# Patient Record
Sex: Female | Born: 1965 | Race: Black or African American | Hispanic: No | Marital: Single | Smoking: Never smoker
Health system: Southern US, Community
[De-identification: ages and names within clinical notes are randomized; demographics above are authoritative.]

## PROBLEM LIST (undated history)

## (undated) DIAGNOSIS — F32A Depression, unspecified: Secondary | ICD-10-CM

## (undated) DIAGNOSIS — F419 Anxiety disorder, unspecified: Secondary | ICD-10-CM

## (undated) DIAGNOSIS — E785 Hyperlipidemia, unspecified: Secondary | ICD-10-CM

## (undated) DIAGNOSIS — T7840XA Allergy, unspecified, initial encounter: Secondary | ICD-10-CM

## (undated) DIAGNOSIS — F329 Major depressive disorder, single episode, unspecified: Secondary | ICD-10-CM

## (undated) DIAGNOSIS — N6489 Other specified disorders of breast: Secondary | ICD-10-CM

## (undated) DIAGNOSIS — K589 Irritable bowel syndrome without diarrhea: Principal | ICD-10-CM

## (undated) DIAGNOSIS — B009 Herpesviral infection, unspecified: Secondary | ICD-10-CM

## (undated) DIAGNOSIS — R7303 Prediabetes: Secondary | ICD-10-CM

## (undated) DIAGNOSIS — R519 Headache, unspecified: Secondary | ICD-10-CM

## (undated) DIAGNOSIS — R51 Headache: Secondary | ICD-10-CM

## (undated) HISTORY — PX: WISDOM TOOTH EXTRACTION: SHX21

## (undated) HISTORY — DX: Irritable bowel syndrome without diarrhea: K58.9

## (undated) HISTORY — DX: Allergy, unspecified, initial encounter: T78.40XA

## (undated) HISTORY — DX: Depression, unspecified: F32.A

## (undated) HISTORY — DX: Prediabetes: R73.03

## (undated) HISTORY — DX: Major depressive disorder, single episode, unspecified: F32.9

## (undated) HISTORY — DX: Hyperlipidemia, unspecified: E78.5

## (undated) HISTORY — DX: Other specified disorders of breast: N64.89

## (undated) HISTORY — DX: Anxiety disorder, unspecified: F41.9

## (undated) HISTORY — PX: COLONOSCOPY: SHX174

## (undated) HISTORY — PX: OTHER SURGICAL HISTORY: SHX169

## (undated) HISTORY — DX: Herpesviral infection, unspecified: B00.9

---

## 1997-12-12 ENCOUNTER — Ambulatory Visit (HOSPITAL_COMMUNITY): Admission: RE | Admit: 1997-12-12 | Discharge: 1997-12-12 | Payer: Self-pay | Admitting: Obstetrics and Gynecology

## 1997-12-30 ENCOUNTER — Ambulatory Visit (HOSPITAL_COMMUNITY): Admission: RE | Admit: 1997-12-30 | Discharge: 1997-12-30 | Payer: Self-pay | Admitting: Obstetrics and Gynecology

## 2002-08-22 ENCOUNTER — Other Ambulatory Visit: Admission: RE | Admit: 2002-08-22 | Discharge: 2002-08-22 | Payer: Self-pay | Admitting: Obstetrics and Gynecology

## 2003-10-17 ENCOUNTER — Other Ambulatory Visit: Admission: RE | Admit: 2003-10-17 | Discharge: 2003-10-17 | Payer: Self-pay | Admitting: Obstetrics and Gynecology

## 2005-01-05 ENCOUNTER — Other Ambulatory Visit: Admission: RE | Admit: 2005-01-05 | Discharge: 2005-01-05 | Payer: Self-pay | Admitting: Obstetrics and Gynecology

## 2006-01-17 ENCOUNTER — Other Ambulatory Visit: Admission: RE | Admit: 2006-01-17 | Discharge: 2006-01-17 | Payer: Self-pay | Admitting: Obstetrics and Gynecology

## 2007-11-22 ENCOUNTER — Emergency Department (HOSPITAL_COMMUNITY): Admission: EM | Admit: 2007-11-22 | Discharge: 2007-11-22 | Payer: Self-pay | Admitting: Emergency Medicine

## 2008-04-16 ENCOUNTER — Ambulatory Visit (HOSPITAL_COMMUNITY): Admission: RE | Admit: 2008-04-16 | Discharge: 2008-04-16 | Payer: Self-pay | Admitting: Obstetrics and Gynecology

## 2008-06-25 ENCOUNTER — Encounter: Admission: RE | Admit: 2008-06-25 | Discharge: 2008-06-25 | Payer: Self-pay | Admitting: Internal Medicine

## 2009-01-22 ENCOUNTER — Ambulatory Visit: Payer: Self-pay | Admitting: Family Medicine

## 2009-01-22 DIAGNOSIS — R519 Headache, unspecified: Secondary | ICD-10-CM | POA: Insufficient documentation

## 2009-01-22 DIAGNOSIS — R51 Headache: Secondary | ICD-10-CM | POA: Insufficient documentation

## 2009-01-27 ENCOUNTER — Encounter: Admission: RE | Admit: 2009-01-27 | Discharge: 2009-01-27 | Payer: Self-pay | Admitting: Family Medicine

## 2009-01-29 ENCOUNTER — Telehealth (INDEPENDENT_AMBULATORY_CARE_PROVIDER_SITE_OTHER): Payer: Self-pay | Admitting: *Deleted

## 2009-03-05 ENCOUNTER — Ambulatory Visit: Payer: Self-pay | Admitting: Family Medicine

## 2009-04-22 ENCOUNTER — Encounter: Admission: RE | Admit: 2009-04-22 | Discharge: 2009-04-22 | Payer: Self-pay | Admitting: Internal Medicine

## 2010-05-06 ENCOUNTER — Encounter
Admission: RE | Admit: 2010-05-06 | Discharge: 2010-05-06 | Payer: Self-pay | Source: Home / Self Care | Admitting: Internal Medicine

## 2010-08-27 IMAGING — MG MM DIGITAL DIAGNOSTIC BILAT
4 series · 4 of 4 positions shown · non-contrast
Comparison: 04/16/2008

CLINICAL DATA: The patient's physician feels a lump in the right
upper inner quadrant.  The patient is not sure of the exact
location.  She states that she has a first cousin and a paternal
aunt who are positive for the breast cancer gene.

DIGITAL DIAGNOSTIC  BILATERAL  MAMMOGRAM  WITH CAD AND RIGHT BREAST
ULTRASOUND:

[R CC]
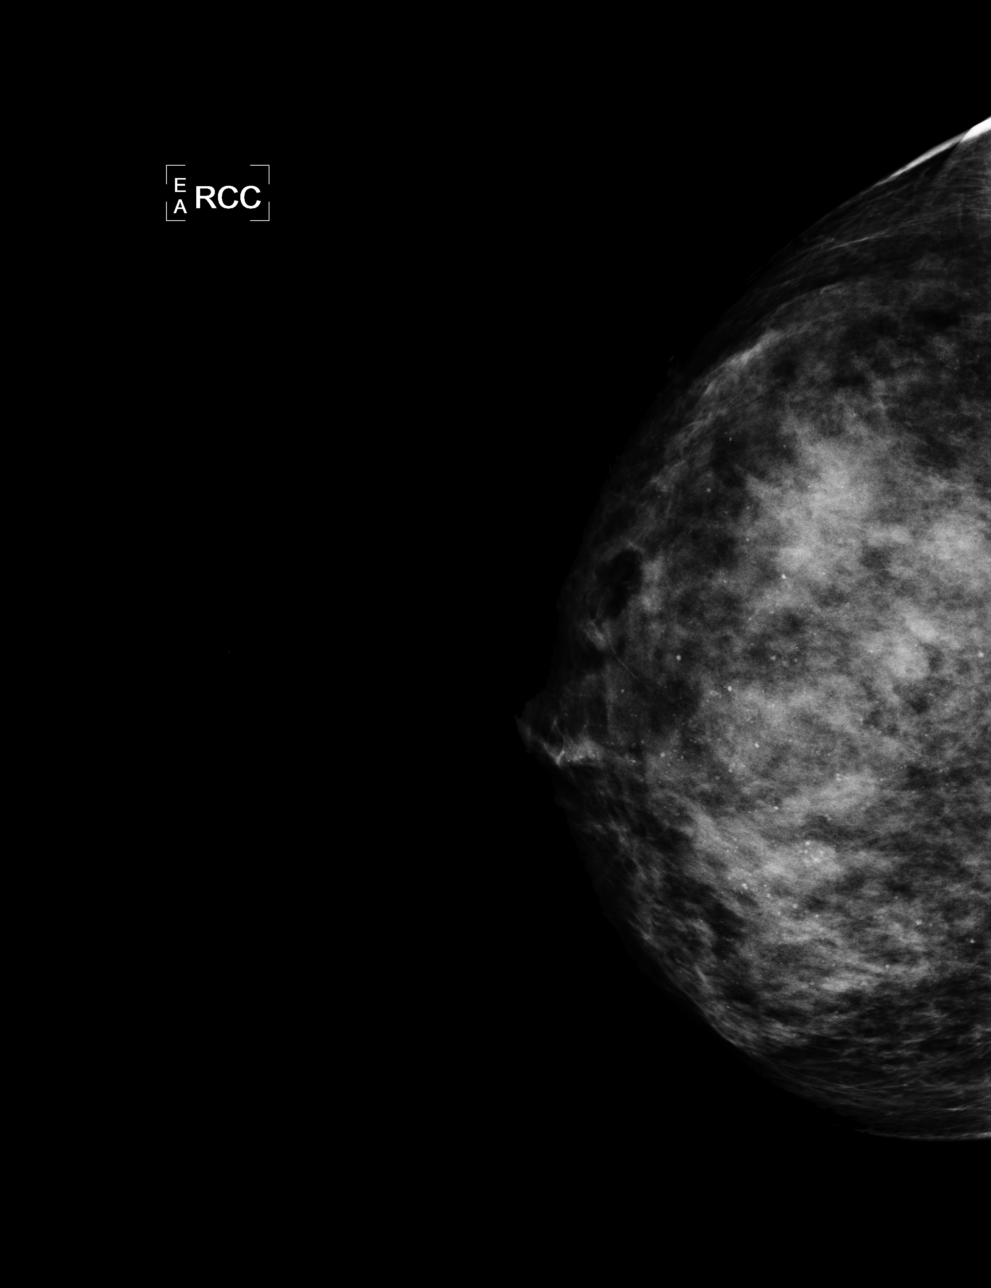

[L CC]
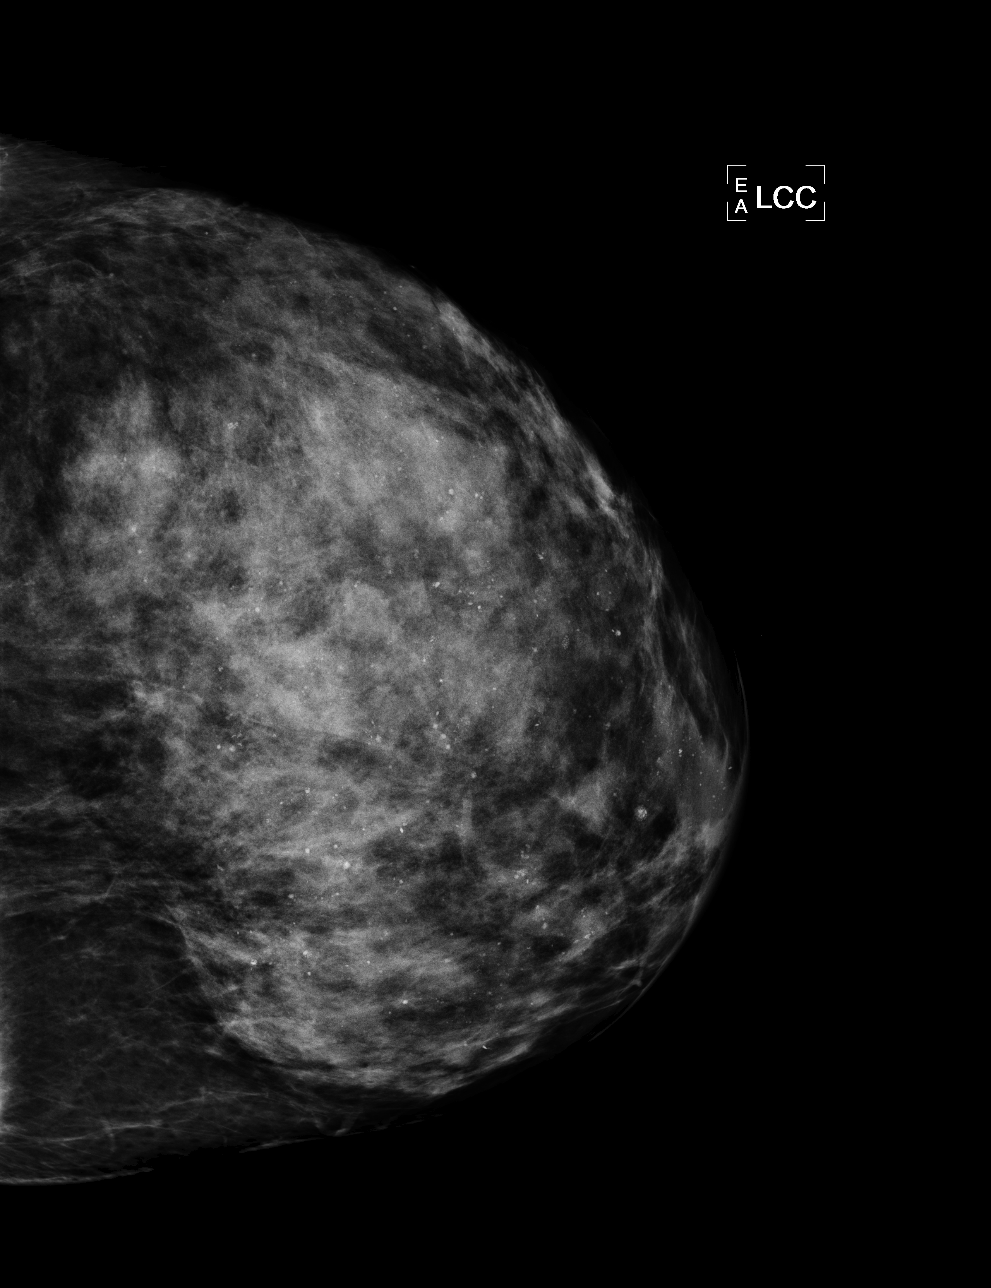

[L MLO]
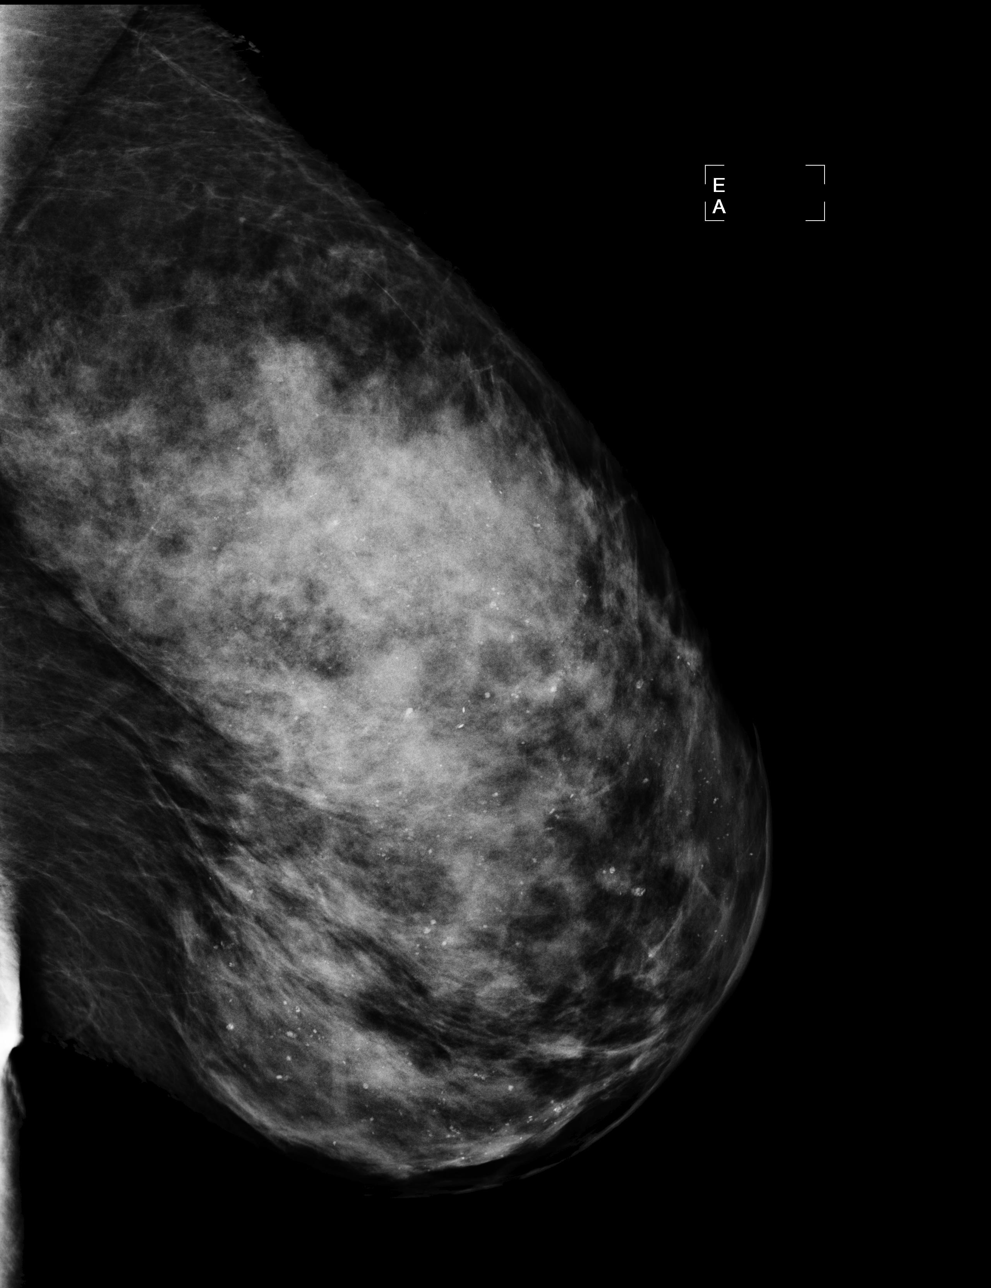

[R MLO]
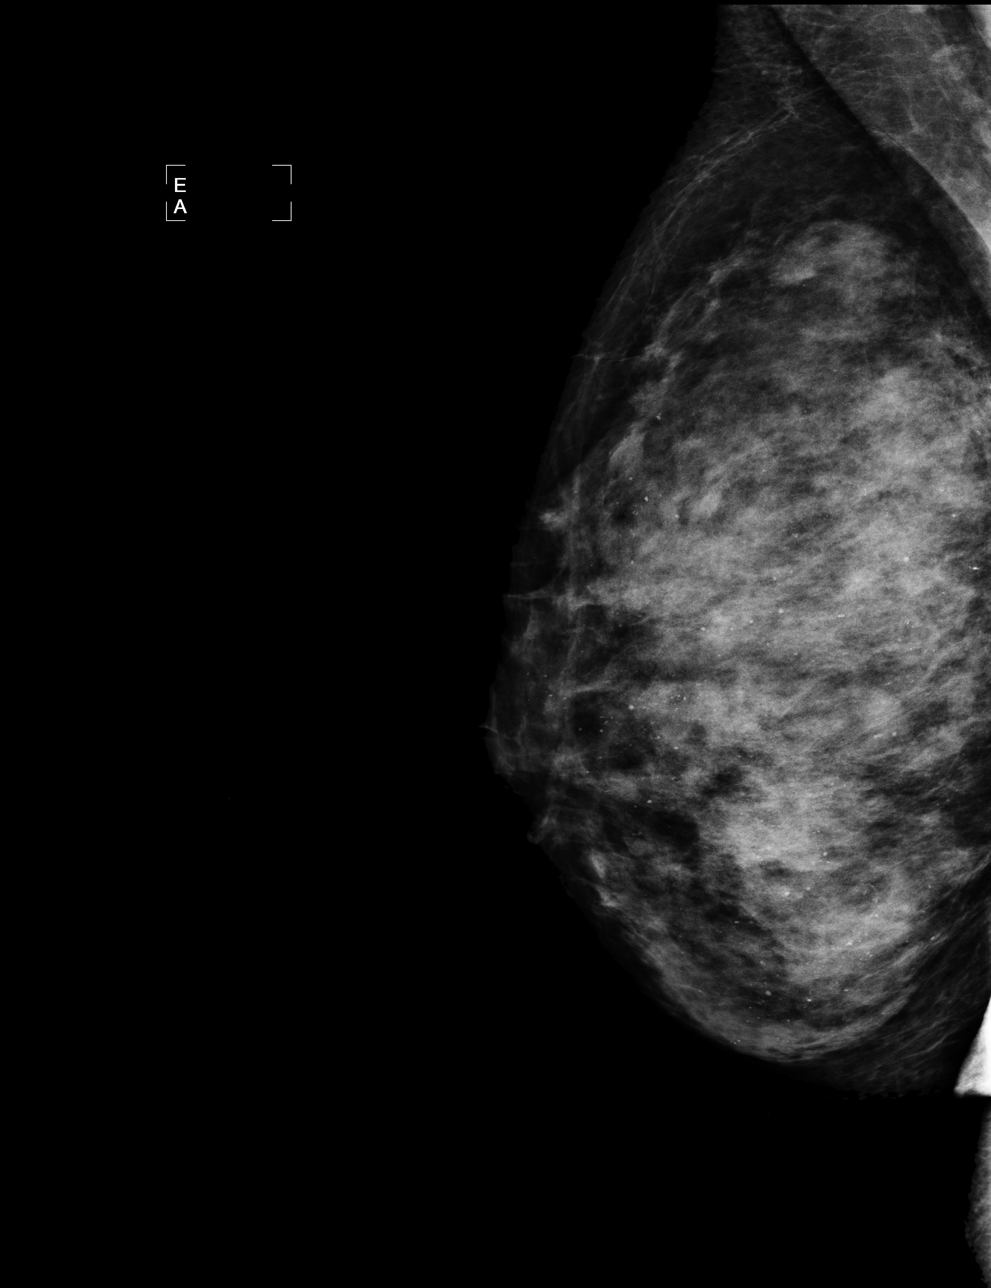

[4 of 4 positions shown; findings below may reference images not displayed]

FINDINGS: The breast tissue is extremely dense.  There is no
suspicious dominant mass, architectural distortion or calcification
to suggest malignancy.
Mammographic images were processed with CAD.

On physical exam, no distinct mass is palpated in the right upper
inner quadrant.

Ultrasound is performed, showing dense fibroglandular tissue.
There are scattered nonpalpable subcentimeter cysts.  There is no
worrisome solid mass, distortion or shadowing to suggest
malignancy.
IMPRESSION: No mammographic or sonographic evidence of malignancy.  Yearly
screening mammography is suggested.

BI-RADS CATEGORY 2:  Benign finding(s).

## 2011-04-12 ENCOUNTER — Other Ambulatory Visit: Payer: Self-pay | Admitting: Internal Medicine

## 2011-04-12 ENCOUNTER — Other Ambulatory Visit (HOSPITAL_COMMUNITY): Payer: Self-pay | Admitting: Internal Medicine

## 2011-04-12 DIAGNOSIS — Z1231 Encounter for screening mammogram for malignant neoplasm of breast: Secondary | ICD-10-CM

## 2011-05-09 ENCOUNTER — Ambulatory Visit: Payer: Self-pay

## 2011-05-12 ENCOUNTER — Ambulatory Visit (HOSPITAL_COMMUNITY)
Admission: RE | Admit: 2011-05-12 | Discharge: 2011-05-12 | Disposition: A | Discharge status: Home / Self Care | Payer: BC Managed Care – PPO | Source: Ambulatory Visit | Attending: Internal Medicine | Admitting: Internal Medicine

## 2011-05-12 DIAGNOSIS — Z1231 Encounter for screening mammogram for malignant neoplasm of breast: Secondary | ICD-10-CM | POA: Insufficient documentation

## 2011-05-16 ENCOUNTER — Other Ambulatory Visit: Payer: Self-pay | Admitting: Internal Medicine

## 2011-05-16 DIAGNOSIS — R928 Other abnormal and inconclusive findings on diagnostic imaging of breast: Secondary | ICD-10-CM

## 2011-06-01 ENCOUNTER — Ambulatory Visit
Admission: RE | Admit: 2011-06-01 | Discharge: 2011-06-01 | Disposition: A | Discharge status: Home / Self Care | Payer: BC Managed Care – PPO | Source: Ambulatory Visit | Attending: Internal Medicine | Admitting: Internal Medicine

## 2011-06-01 DIAGNOSIS — R928 Other abnormal and inconclusive findings on diagnostic imaging of breast: Secondary | ICD-10-CM

## 2011-06-03 ENCOUNTER — Other Ambulatory Visit: Payer: BC Managed Care – PPO

## 2012-01-19 ENCOUNTER — Ambulatory Visit (INDEPENDENT_AMBULATORY_CARE_PROVIDER_SITE_OTHER): Payer: BC Managed Care – PPO | Admitting: Obstetrics and Gynecology

## 2012-01-19 ENCOUNTER — Encounter: Payer: Self-pay | Admitting: Obstetrics and Gynecology

## 2012-01-19 VITALS — BP 104/68 | Ht 64.5 in | Wt 156.0 lb

## 2012-01-19 DIAGNOSIS — N6019 Diffuse cystic mastopathy of unspecified breast: Secondary | ICD-10-CM

## 2012-01-19 DIAGNOSIS — N6012 Diffuse cystic mastopathy of left breast: Secondary | ICD-10-CM | POA: Insufficient documentation

## 2012-01-19 MED ORDER — VALACYCLOVIR HCL 500 MG PO TABS: 500.0000 mg | ORAL_TABLET | Freq: Two times a day (BID) | ORAL | Status: AC

## 2012-01-19 NOTE — Progress Notes (Signed)
Subjective:  Crystal Norton is a 46 y.o. female, G0P0000, who presents for an annual exam. She has a vague hx of LLq pain yesterday which was self limited.  She has a hx of IBS with constipation. Last Pap: 06/03/2009 WNL: Yes Regular Periods:yes Contraception: abst  Monthly Breast exam:yes Tetanus<8yrs:yes Nl.Bladder Function:yes Daily BMs:no Healthy Diet:yes Calcium:no Mammogram:yes Date of Mammogram: 05/12/2011 Exercise:no Have often Exercise: occ Seatbelt: yes Abuse at home: no Stressful work:no Sigmoid-colonoscopy: no Bone Density: No PCP: Dr. Allyne Gee Change in PMH: no change Change in FMH:no change BMI-27     History   Social History  . Marital Status: Single    Spouse Name: N/A    Number of Children: N/A  . Years of Education: N/A   Social History Main Topics  . Smoking status: Never Smoker   . Smokeless tobacco: Never Used  . Alcohol Use: No  . Drug Use: No  . Sexually Active: No   Other Topics Concern  . None   Social History Narrative  . None    Menstrual cycle:   LMP: Patient's last menstrual period was 12/31/2011.           Cycle: Some irreg in June then 2 cycles in July.  Otherwise monthly menses  The following portions of the patient's history were reviewed and updated as appropriate: allergies, current medications, past family history, past medical history, past social history, past surgical history and problem list.  Review of Systems Pertinent items are noted in HPI. Breast:Negative for breast lump,nipple discharge or nipple retraction Gastrointestinal: Negative for abdominal pain, change in bowel habits or rectal bleeding Urinary:negative   Objective:    BP 104/68  Ht 5' 4.5" (1.638 m)  Wt 156 lb (70.761 kg)  BMI 26.36 kg/m2  LMP 12/31/2011    Weight:  Wt Readings from Last 1 Encounters:  01/19/12 156 lb (70.761 kg)          BMI: Body mass index is 26.36 kg/(m^2).  General Appearance: Alert, appropriate appearance for  age. No acute distress HEENT: Grossly normal Neck / Thyroid: Supple, no masses, nodes or enlargement Lungs: clear to auscultation bilaterally Back: No CVA tenderness Breast Exam: asymmetric size is chronic.  Bilateral fibrocystic changes without dominant masses, discharge or skin changes Cardiovascular: Regular rate and rhythm. S1, S2, no murmur Gastrointestinal: Soft, non-tender, no masses or organomegaly Pelvic Exam: Vulva and vagina appear normal. Bimanual exam reveals normal uterus and adnexa. Rectovaginal: normal rectal, no masses Lymphatic Exam: Non-palpable nodes in neck, clavicular, axillary, or inguinal regions Skin: no rash or abnormalities Neurologic: Normal gait and speech, no tremor  Psychiatric: Alert and oriented, appropriate affect.   Wet Prep:not applicable Urinalysis:not applicable UPT: Not done   Assessment:    Normal gyn exam Breast asymmetry- stable  New onset transient LLQ pain   Plan:    mammogram pap smear due 2014 return annually or prn Calendar menses and pain for the next 3 months and RTO if persists. Pt will call with name of GI MD seen previously for referral for colonoscopy for family hx of colon cancer STD screening: declined Contraception:no method, declines      Dierdre Forth MD

## 2012-02-22 ENCOUNTER — Ambulatory Visit: Payer: Self-pay | Admitting: Obstetrics and Gynecology

## 2012-04-13 ENCOUNTER — Telehealth: Payer: Self-pay | Admitting: Obstetrics and Gynecology

## 2012-04-13 NOTE — Telephone Encounter (Signed)
Pt made aware last pap was 2011 not due until 2014 Pt advised to make provider aware that she always wants her pap done  Even if her results were WNL. Pt agreeable   [facility] CMA

## 2012-04-13 NOTE — Telephone Encounter (Signed)
Pt was called back regarding request for results. Pt thought she had a pap smear done. It is documented her pap is not due until 2014 Pt chart was pulled and last pap was 01/11/2010"WNL" Pt hung up while I had her on hold  Pt was called back and not ava will await pt call back.  [facility] CMA

## 2012-05-02 ENCOUNTER — Telehealth: Payer: Self-pay | Admitting: Obstetrics and Gynecology

## 2012-05-02 ENCOUNTER — Other Ambulatory Visit: Payer: Self-pay | Admitting: Obstetrics and Gynecology

## 2012-05-02 DIAGNOSIS — Z1231 Encounter for screening mammogram for malignant neoplasm of breast: Secondary | ICD-10-CM

## 2012-05-02 NOTE — Telephone Encounter (Signed)
Spoke with pt rgd concerns pt states had AEX 01/2012 wants referral for colonoscopy due to family history was advised by [facility] to call with provider information that she previously seen for stomach issues pt calling with that information and to get referral for colonoscopy pt seen Dr.James Edwards 09-11-2006 at [facility] [phone] and Dr.George Virginia Rochester 01-09-2007 at [facility] advised pt to sign release of information and have records faxed to office so [facility] and view records to make referral advised pt will send information  to [facility] pt voice understanding

## 2012-05-02 NOTE — Telephone Encounter (Signed)
Returning your call. °

## 2012-05-10 NOTE — Telephone Encounter (Signed)
rec pt be referred back to [facility] since her PCP is there as well

## 2012-05-10 NOTE — Telephone Encounter (Signed)
plese call pt thanks

## 2012-05-16 NOTE — Telephone Encounter (Signed)
Lm on vm to cb per [facility] recs.  

## 2012-05-25 NOTE — Telephone Encounter (Signed)
Lm on vm to cb per [facility] recs.

## 2012-05-28 NOTE — Telephone Encounter (Signed)
Tc from pt. Told pt [facility] recs. Pt voices understanding. Will follow up with pt once referral make.

## 2012-05-28 NOTE — Telephone Encounter (Signed)
Consulted with [facility], told needs records from prev GI physician pt saw and will review records to determine if appt can be scheduled. Pt voices understanding. Awaiting prev GI records to fax to [facility] for review.

## 2012-05-31 ENCOUNTER — Ambulatory Visit (HOSPITAL_COMMUNITY)
Admission: RE | Admit: 2012-05-31 | Discharge: 2012-05-31 | Disposition: A | Discharge status: Home / Self Care | Payer: BC Managed Care – PPO | Source: Ambulatory Visit | Attending: Obstetrics and Gynecology | Admitting: Obstetrics and Gynecology

## 2012-05-31 DIAGNOSIS — Z1231 Encounter for screening mammogram for malignant neoplasm of breast: Secondary | ICD-10-CM | POA: Insufficient documentation

## 2012-06-01 ENCOUNTER — Ambulatory Visit (HOSPITAL_COMMUNITY): Payer: BC Managed Care – PPO

## 2012-06-06 ENCOUNTER — Encounter: Payer: Self-pay | Admitting: Obstetrics and Gynecology

## 2012-06-06 DIAGNOSIS — R922 Inconclusive mammogram: Secondary | ICD-10-CM | POA: Insufficient documentation

## 2012-06-07 ENCOUNTER — Encounter: Payer: Self-pay | Admitting: Obstetrics and Gynecology

## 2012-06-19 ENCOUNTER — Ambulatory Visit (HOSPITAL_COMMUNITY): Payer: BC Managed Care – PPO

## 2013-03-13 ENCOUNTER — Telehealth: Payer: Self-pay | Admitting: Genetic Counselor

## 2013-03-13 NOTE — Telephone Encounter (Signed)
LVOM FOR PT TO RETURN CALL IN RE TO REFERRAL.  °

## 2013-03-14 ENCOUNTER — Telehealth: Payer: Self-pay | Admitting: Genetic Counselor

## 2013-03-14 NOTE — Telephone Encounter (Signed)
2nd. Lm for pt to return call in re to referral.

## 2013-03-21 ENCOUNTER — Encounter (HOSPITAL_COMMUNITY): Payer: Self-pay | Admitting: Emergency Medicine

## 2013-03-21 ENCOUNTER — Emergency Department (INDEPENDENT_AMBULATORY_CARE_PROVIDER_SITE_OTHER)
Admission: EM | Admit: 2013-03-21 | Discharge: 2013-03-21 | Disposition: A | Discharge status: Home / Self Care | Payer: Worker's Compensation | Source: Home / Self Care | Attending: Emergency Medicine | Admitting: Emergency Medicine

## 2013-03-21 DIAGNOSIS — T07XXXA Unspecified multiple injuries, initial encounter: Secondary | ICD-10-CM

## 2013-03-21 MED ORDER — HYDROCODONE-ACETAMINOPHEN 5-325 MG PO TABS: ORAL_TABLET | ORAL | Status: DC

## 2013-03-21 NOTE — ED Provider Notes (Signed)
Chief Complaint:   Chief Complaint  Patient presents with  . 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 She tripped over a chair, falling to her right side. She did not hit her head and there was no loss of consciousness. She was able to get up thereafter, but had soreness and aching in multiple areas including her neck, right shoulder, left shoulder, right knee which was bruised, both feet, left wrist, and right flank area. She denies any headache. She does feel somewhat dizzy and notes slight disequilibrium. She denies any visual symptoms, injury to the chest or abdomen, or neurological symptoms.  Review of Systems:  Other than noted above, the patient denies any of the following symptoms: Systemic:  No fevers or chills. Eye:  No diplopia or blurred vision. ENT:  No headache, facial pain, or bleeding from the nose or ears.  No loose or broken teeth. Neck:  No neck pain or stiffnes. Resp:  No shortness of breath. Cardiac:  No chest pain. No palpitations, dizziness, syncope or fainting. GI:  No abdominal pain. No nausea, vomiting, or diarrhea. GU:  No blood in urine. M-S:  No extremity pain, swelling, bruising, limited ROM, neck or back pain. Neuro:  No headache, loss of consciousness, seizure activity, dizziness, vertigo, paresthesias, numbness, or weakness.  No difficulty with speech or ambulation.   PMFSH:  Past medical history, family history, social history, meds, and allergies were reviewed.  Her only medication is Valtrex.  Physical Exam:   Vital signs:  BP 121/84  Pulse 73  Temp(Src) 98.5 F (36.9 C) (Oral)  Resp 18  SpO2 100%  LMP 01/21/2013 General:  Alert, oriented and in no distress. Eye:  PERRL, full EOMs. ENT:  No cranial or facial tenderness to palpation. Neck:  No tenderness to palpation.  Full ROM pain on rotation. Heart:  Regular rhythm.  No  extrasystoles, gallops, or murmers. Lungs:  No chest wall tenderness to palpation. Breath sounds clear and equal bilaterally.  No wheezes, rales or rhonchi. Abdomen:  Non tender. Back:  She has mild tenderness to palpation in the right flank area.  Full ROM without pain. Extremities:  There is mild tenderness to palpation in both shoulders, both both shoulders have full range of motion with only minimal pain. She has slight soreness to palpation in the left upper arm. There is no pain to palpation over the elbows, wrists, or hands. Exam of the lower extremities reveals some bruising and slight pain to palpation just below the right patella, but her knee has a full range of motion with only minimal pain. Otherwise lower extremity exam was unremarkable.  Pulses full.  Brisk capillary refill. Neuro:  Alert and oriented times 3.  Cranial nerves intact.  No muscle weakness.  Sensation intact to light touch.  Gait normal. Skin:   No abrasions, or lacerations. She has a bruise just below the right patella.  Course in Urgent Care Center:   The right knee was wrapped with Ace wrap.  Assessment:  The encounter diagnosis was Multiple contusions.  No indication for x-rays. She was given a note to remain out of work tomorrow. Should followup at occupational clinic next week if she feels she is unable to return to work by Monday.  Plan:   1.  Meds:  The following meds were prescribed:   Discharge Medication List as of 03/21/2013  9:25 PM    START taking these medications   Details  HYDROcodone-acetaminophen (NORCO/VICODIN) 5-325 MG per tablet 1 to 2 tabs every 4 to 6 hours as needed for pain., Print        2.  Patient Education/Counseling:  The patient was given appropriate handouts, self care instructions, and instructed in symptomatic relief.  Instructed in use of rest, ice, elevation, and compression.  3.  Follow up:  The patient was told to follow up if no better in 3 to 4 days, if becoming worse in  any way, and given some red flag symptoms such as worsening pain, severe headache, new neurological symptoms, or persistent vomiting which would prompt immediate return.  Follow up here as necessary or at the occupational clinic.     Reuben Likes, MD 03/21/13 2200

## 2013-03-21 NOTE — ED Notes (Addendum)
Tripped over a chair and/orcord in the classroom today.  Pt. 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 States she fell on her R side.  C/o pain in both upper arms, R knee, R side of neck down to her shoulder and dorsum L foot.  No LOC.  Consc. and alert and ambulatory.  She reported it to her Musician.

## 2013-03-25 ENCOUNTER — Telehealth: Payer: Self-pay | Admitting: Genetic Counselor

## 2013-03-25 NOTE — Telephone Encounter (Signed)
Returned call to discuss cost of testing.  Patient has a known family mutation.  Discussed that all labs will preauthorize testing and let her know if she has an OOP cost over $100.  Asked patient to bring in a copy of the test report that indicates the family mutation.

## 2013-03-25 NOTE — Telephone Encounter (Signed)
Pt called to scheduled genetic appt 01/26 @ 2 w/Dr. Lowell Guitar Welcome packet mailed.

## 2013-05-24 ENCOUNTER — Other Ambulatory Visit (HOSPITAL_COMMUNITY): Payer: Self-pay | Admitting: Internal Medicine

## 2013-05-24 ENCOUNTER — Other Ambulatory Visit: Payer: Self-pay | Admitting: Internal Medicine

## 2013-05-24 DIAGNOSIS — Z1231 Encounter for screening mammogram for malignant neoplasm of breast: Secondary | ICD-10-CM

## 2013-06-03 ENCOUNTER — Ambulatory Visit: Payer: Self-pay

## 2013-06-04 ENCOUNTER — Ambulatory Visit
Admission: RE | Admit: 2013-06-04 | Discharge: 2013-06-04 | Disposition: A | Discharge status: Home / Self Care | Payer: BC Managed Care – PPO | Source: Ambulatory Visit | Attending: Internal Medicine | Admitting: Internal Medicine

## 2013-06-04 ENCOUNTER — Other Ambulatory Visit: Payer: Self-pay | Admitting: Occupational Medicine

## 2013-06-04 ENCOUNTER — Ambulatory Visit: Payer: Self-pay

## 2013-06-04 DIAGNOSIS — R52 Pain, unspecified: Secondary | ICD-10-CM

## 2013-06-04 DIAGNOSIS — Z1231 Encounter for screening mammogram for malignant neoplasm of breast: Secondary | ICD-10-CM

## 2013-06-10 ENCOUNTER — Ambulatory Visit (HOSPITAL_COMMUNITY): Payer: BC Managed Care – PPO

## 2013-07-01 ENCOUNTER — Encounter: Payer: Self-pay | Admitting: Genetic Counselor

## 2013-07-01 ENCOUNTER — Other Ambulatory Visit: Payer: Self-pay

## 2013-08-30 ENCOUNTER — Telehealth: Payer: Self-pay | Admitting: *Deleted

## 2013-08-30 NOTE — Telephone Encounter (Signed)
Left message for pt to return my call so I can reschedule her genetic appt. 

## 2013-09-03 ENCOUNTER — Telehealth: Payer: Self-pay | Admitting: *Deleted

## 2013-09-03 NOTE — Telephone Encounter (Signed)
Left message for pt to return my call so I can reschedule her genetic appt. 

## 2013-09-05 ENCOUNTER — Telehealth: Payer: Self-pay | Admitting: *Deleted

## 2013-09-05 NOTE — Telephone Encounter (Signed)
Pt returned my call and she stated that she is having some arm troubles and cannot come in on Monday for her genetic appt.  I informed her that I had been trying to get in touch with her to reschedule her due to Santiago Glad leaving and she wants to cancel the appt for right now so she can get her arm taken care of and then she will call us back at a later date to reschedule.

## 2013-09-09 ENCOUNTER — Encounter: Payer: Self-pay | Admitting: Genetic Counselor

## 2013-09-09 ENCOUNTER — Other Ambulatory Visit: Payer: Self-pay

## 2013-10-03 ENCOUNTER — Other Ambulatory Visit: Payer: Self-pay | Admitting: Obstetrics and Gynecology

## 2013-10-03 DIAGNOSIS — D242 Benign neoplasm of left breast: Secondary | ICD-10-CM

## 2013-10-10 ENCOUNTER — Ambulatory Visit
Admission: RE | Admit: 2013-10-10 | Discharge: 2013-10-10 | Disposition: A | Discharge status: Home / Self Care | Payer: BC Managed Care – PPO | Source: Ambulatory Visit | Attending: Obstetrics and Gynecology | Admitting: Obstetrics and Gynecology

## 2013-10-10 DIAGNOSIS — D242 Benign neoplasm of left breast: Secondary | ICD-10-CM

## 2013-10-17 ENCOUNTER — Ambulatory Visit (INDEPENDENT_AMBULATORY_CARE_PROVIDER_SITE_OTHER): Payer: BC Managed Care – PPO | Admitting: General Surgery

## 2013-11-01 ENCOUNTER — Encounter (INDEPENDENT_AMBULATORY_CARE_PROVIDER_SITE_OTHER): Payer: Self-pay | Admitting: General Surgery

## 2013-11-01 ENCOUNTER — Ambulatory Visit (INDEPENDENT_AMBULATORY_CARE_PROVIDER_SITE_OTHER): Payer: BC Managed Care – PPO | Admitting: General Surgery

## 2013-11-01 VITALS — BP 118/80 | HR 80 | Temp 97.5°F | Resp 16 | Wt 154.0 lb

## 2013-11-01 DIAGNOSIS — N6452 Nipple discharge: Secondary | ICD-10-CM

## 2013-11-01 DIAGNOSIS — N6459 Other signs and symptoms in breast: Secondary | ICD-10-CM

## 2013-11-01 NOTE — Patient Instructions (Signed)

## 2013-11-01 NOTE — Progress Notes (Signed)
Patient ID: EMMANUELLE HIBBITTS, female   DOB: 01/23/1966, 48 y.o.   MRN: [id number]  Chief Complaint  Patient presents with  . New Evaluation    eval breast nipple discharge    HPI Tarissa GUIDA ASMAN is a 48 y.o. female.  Referred by Dr Conchita Paris HPI This is a 48 year old female who does have a family history of multiple cousins who have a BRCA mutation. She herself has never been tested. She has a history of a core biopsy in 1999 on the left side is benign. She does have a significant discrepancy between the left and the right breast is considered a reduction in the past. She in April had several days of some spontaneous left nipple discharge it was brownish. This was never bloody. This occurred for 3 days in a row and then another day but she has had nothing since then. She has no abnormality on her own exams she can identify. She underwent evaluation with a mammogram as well as ultrasound. Her breasts are density category the but this is otherwise a very normal. There are no abnormal physical findings or mammographic or ultrasound findings. She comes in today to discuss this discharge in the past. Past Medical History  Diagnosis Date  . HSV-2 (herpes simplex virus 2) infection   . Breast asymmetry     Past Surgical History  Procedure Laterality Date  . Wisdom tooth extraction    . Needle biopsy of breast      Family History  Problem Relation Age of Onset  . Hypertension Mother   . Hypertension Father   . Diabetes Father   . Cancer Paternal Aunt     breast  . Cancer Paternal Aunt     breast    Social History History  Substance Use Topics  . Smoking status: Never Smoker   . Smokeless tobacco: Never Used  . Alcohol Use: No    No Known Allergies  Current Outpatient Prescriptions  Medication Sig Dispense Refill  . medroxyPROGESTERone (PROVERA) 10 MG tablet       . valACYclovir (VALTREX) 500 MG tablet Take 1 tablet (500 mg total) by mouth 2 (two) times daily.  60 tablet  12    No current facility-administered medications for this visit.    Review of Systems Review of Systems  Constitutional: Negative for fever, chills and unexpected weight change.  HENT: Positive for congestion. Negative for hearing loss, sore throat, trouble swallowing and voice change.   Eyes: Negative for visual disturbance.  Respiratory: Negative for cough and wheezing.   Cardiovascular: Negative for chest pain, palpitations and leg swelling.  Gastrointestinal: Positive for constipation. Negative for nausea, vomiting, abdominal pain, diarrhea, blood in stool, abdominal distention and anal bleeding.  Genitourinary: Negative for hematuria, vaginal bleeding and difficulty urinating.  Musculoskeletal: Negative for arthralgias.  Skin: Negative for rash and wound.  Neurological: Positive for headaches. Negative for seizures and syncope.  Hematological: Negative for adenopathy. Does not bruise/bleed easily.  Psychiatric/Behavioral: Negative for confusion.    Blood pressure 118/80, pulse 80, temperature 97.5 F (36.4 C), resp. rate 16, weight 154 lb (69.854 kg).  Physical Exam Physical Exam  Vitals reviewed. Constitutional: She appears well-developed and well-nourished.  Pulmonary/Chest: Right breast exhibits no inverted nipple, no mass, no nipple discharge, no skin change and no tenderness. Left breast exhibits no inverted nipple, no mass, no nipple discharge, no skin change and no tenderness.  Lymphadenopathy:    She has no cervical adenopathy.    She has  no axillary adenopathy.       Right: No supraclavicular adenopathy present.       Left: No supraclavicular adenopathy present.    Data Reviewed EXAM:  DIGITAL DIAGNOSTIC left MAMMOGRAM WITH CAD  ULTRASOUND left BREAST  COMPARISON: Prior exams, including most recent screening mammogram  December 2014  ACR Breast Density Category d: The breast tissue is extremely dense,  which lowers the sensitivity of mammography.  FINDINGS:   No new suspicious finding is seen in the left breast. Multiple areas  of benign-appearing milk of calcium and other benign stable  calcifications are reidentified.  Mammographic images were processed with CAD.  On physical exam, I am unable to elicit left nipple discharge.  Ultrasound is performed, showing no retroareolar duct ectasia or  mass. A few scattered cysts are noted in the central left breast  elsewhere.  IMPRESSION:  Probably benign left breast discharge most likely representing  fibrocystic change as seen on today and prior exams. No evidence for  malignancy. The patient is encouraged to keep her follow-up  appointment with Dr. Dalbert Batman anterior port any other changes to her  physician.   Assessment    Nipple discharge Family history of brca      Plan    I don't think there is anything more to do with the discharge right now. I think this was benign and is not occurring anymore. Her imaging is all normal. I told her to call me back if she continues to have a discharge. I did tell her that I do think a genetics referral would be reasonable. She is going to followup with genetics. She will continue her own self exams. I recommended a 3-D mammogram the next time she gets a mammogram also. She's going to come back and see me as needed.  Also offered her a plastic surgery consultation if she does so desired.        Rolm Bookbinder 11/01/2013, 10:15 AM

## 2013-11-04 ENCOUNTER — Telehealth: Payer: Self-pay | Admitting: *Deleted

## 2013-11-04 NOTE — Telephone Encounter (Signed)
Left message for pt to return my call so I can schedule a genetic appt w/ her.  

## 2013-11-06 ENCOUNTER — Telehealth: Payer: Self-pay | Admitting: *Deleted

## 2013-11-06 NOTE — Telephone Encounter (Signed)
Received referral from pt and called to schedule a genetic appt.  Pt informed me that she needs to know exactly what is expected of her to pay.  I informed her that for the genetics - its split two ways and then she wanted to know the exact amount she needed to bring in for the genetic counseling appt.  I informed her that I do not do insurance and could not give her that information, that I would need to send those questions to the person that could.  She agreed for someone to call her.  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  I emailed Lenise and Raquel to have them f/u with the patient about payment at time of check in for the counseling visit.  She was in question about the amount she has to pay for the test to be done and I informed her that we would not know until she arrived and spoke with the counselor to determine if she met criteria, which panel needed to be done and which lab it would be sent too.  She stated that she has a letter that would allow her to get a discount.  I informed her that I would send that information to the counselor that would be seeing her that day to see if she could do some pro-active work for her for the visit.  She agreed for me to send that and thanked me because she did not want to come here with the unknown of how much all of this was going to cost.  I emailed Barnetta Chapel Fine to make her aware of the pt and the letter and requested if she would to contact the pt at 3:45 or after to discuss further with her so she would feel comfortable about this appt.  We discussed location, what do do from time she walked in the door til she walked out.  We discussed the length of the visit and I told her that it could be from an hour to hour and a half depending on questions and answers with the counselor and if she decided to go forward with the test then we would take her to the lab for the blood draw and if there was a wait then it could  take a little longer.  She was agreeable with that as well.  She decided to go ahead and schedule the genetic appt and I confirmed 11/22/13 genetic appt w/ pt with the knowledge that she could cancel if they came back with more than she is willing and able to pay. Spent approx. 28.40mins on the phone with the pt.

## 2013-11-07 ENCOUNTER — Encounter: Payer: Self-pay | Admitting: Internal Medicine

## 2013-11-07 ENCOUNTER — Encounter: Payer: Self-pay | Admitting: Genetic Counselor

## 2013-11-07 NOTE — Progress Notes (Signed)
Called and left the patient a message to call me back. She inquired about cost for spec visits.

## 2013-11-08 ENCOUNTER — Encounter: Payer: Self-pay | Admitting: *Deleted

## 2013-11-22 ENCOUNTER — Other Ambulatory Visit: Payer: Self-pay

## 2013-12-05 ENCOUNTER — Ambulatory Visit: Payer: Self-pay | Admitting: Internal Medicine

## 2013-12-20 ENCOUNTER — Other Ambulatory Visit: Payer: Self-pay

## 2013-12-25 ENCOUNTER — Telehealth: Payer: Self-pay | Admitting: *Deleted

## 2013-12-25 NOTE — Telephone Encounter (Signed)
Spoke with patient to follow up on genetic counseling appointment.  Patient states she her father has just passed away and she cannot deal with this right now.  She states she will call back when she is ready to schedule a genetic counseling appointment.

## 2014-01-29 ENCOUNTER — Telehealth: Payer: Self-pay

## 2014-01-29 NOTE — Telephone Encounter (Signed)
Left message on her mobile voicemail to call me.  She has an appointment with Dr. Carlean Purl on 03/05/14 and I was hoping to get her to sign a records release so we could get her [facility] records prior to her appointment.  I could fax her the ROI to sign if that would work on her end.

## 2014-01-30 ENCOUNTER — Ambulatory Visit: Payer: Self-pay | Admitting: Internal Medicine

## 2014-03-05 ENCOUNTER — Ambulatory Visit (INDEPENDENT_AMBULATORY_CARE_PROVIDER_SITE_OTHER): Payer: BC Managed Care – PPO | Admitting: Internal Medicine

## 2014-03-05 ENCOUNTER — Encounter: Payer: Self-pay | Admitting: Internal Medicine

## 2014-03-05 VITALS — BP 100/70 | HR 100 | Ht 64.5 in | Wt 156.0 lb

## 2014-03-05 DIAGNOSIS — Z1211 Encounter for screening for malignant neoplasm of colon: Secondary | ICD-10-CM

## 2014-03-05 DIAGNOSIS — K589 Irritable bowel syndrome without diarrhea: Secondary | ICD-10-CM

## 2014-03-05 HISTORY — DX: Irritable bowel syndrome, unspecified: K58.9

## 2014-03-05 NOTE — Patient Instructions (Addendum)
You have been scheduled for a colonoscopy. Please follow written instructions given to you at your visit today.  Please pick up your prep supplies at the pharmacy. If you use inhalers (even only as needed), please bring them with you on the day of your procedure. Your physician has requested that you go to www.startemmi.com and enter the access code given to you at your visit today. This web site gives a general overview about your procedure. However, you should still follow specific instructions given to you by our office regarding your preparation for the procedure.  Take your probiotic daily.  Today you have been given a FODMAP to read over and try.  We are giving you some exercise information to read over.  We will obtain your records from Dr. Oletta Lamas office for review.  You have been given a work note today.  I appreciate the opportunity to care for you.

## 2014-03-05 NOTE — Progress Notes (Signed)
Subjective:    Patient ID: Crystal Norton, female    DOB: 13-Apr-1966, 48 y.o.   MRN: [id number]  HPI  The patient is a very nice middle-aged African-American woman with a long history of constipation since childhood. She has managed with fiber in diet over the years. She had a rough year with increased stress (father died) and had worsening of constipation and also had bloating problems. Increased fruits and vegetables has helped produce complete defecation 3+ years ago. She has had problems with incomplete defecation in past. No rectal bleeding. No sig abdominal pain. Has seen Dr. Oletta Lamas in past and told she had IBS, she recalls he treated her with Abx. She does take probiotics now, but recently ran out.  Has had 1-2 dark stools, thinks they were black, in past 2 months. Denies Pepto-Bismol. No Known Allergies Outpatient Prescriptions Prior to Visit  Medication Sig Dispense Refill  . valACYclovir (VALTREX) 500 MG tablet Take 1 tablet (500 mg total) by mouth 2 (two) times daily.  60 tablet  12  . medroxyPROGESTERone (PROVERA) 10 MG tablet        No facility-administered medications prior to visit.   Past Medical History  Diagnosis Date  . HSV-2 (herpes simplex virus 2) infection   . Breast asymmetry   . IBS (irritable bowel syndrome) 03/05/2014   Past Surgical History  Procedure Laterality Date  . Wisdom tooth extraction    . Needle biopsy of breast     History   Social History  . Marital Status: Single    Spouse Name: N/A    Number of Children: N/A  . Years of Education: N/A   Social History Main Topics  . Smoking status: Never Smoker   . Smokeless tobacco: Never Used  . Alcohol Use: No  . Drug Use: No  . Sexual Activity: No   Other Topics Concern  . 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 Hypertension Mother   .  Hypertension Father   . Diabetes Father   . Breast cancer Paternal Aunt   . Breast cancer Paternal Aunt   . Stroke    . Heart disease    . Diabetes    . Colon cancer     Review of Systems As above also + ankle edema. All other ROS negative.    Objective:   Physical Exam General:  Well-developed, well-nourished and in no acute distress Eyes:  anicteric. ENT:   Mouth and posterior pharynx free of lesions.  Neck:   supple w/o thyromegaly or mass.  Lungs: Clear to auscultation bilaterally. Heart:  S1S2, no rubs, murmurs, gallops. Abdomen:  soft, non-tender, no hepatosplenomegaly, hernia, or mass and BS+.  Rectal: deferred Lymph:  no cervical or supraclavicular adenopathy. Extremities:   no edema Skin   no rash. Neuro:  A&O x 3.  Psych:  appropriate mood and  Affect.   Data Reviewed: Have requested prior GI notes She reports NL Hgb and TSH this year    Assessment & Plan:  IBS (irritable bowel syndrome)  Special screening for malignant neoplasms, colon - Plan: Ambulatory referral to Gastroenterology   1. Core abd exercises discussed, handout provided, to strengthen abdominal wall and reduce distention 2. Daily Probiotic 3. FODMAPS diet, to reduce anything she eats large quantities of  on this list, otherwise continue fruits/vegetables 4. Screening colonoscopy. The risks and benefits as well as alternatives of endoscopic procedure(s) have been discussed and reviewed. All questions answered. The patient agrees to proceed.  I appreciate the opportunity to care for this patient.  TS:[id number] N, MD

## 2014-03-10 ENCOUNTER — Encounter: Payer: Self-pay | Admitting: Internal Medicine

## 2014-04-23 ENCOUNTER — Other Ambulatory Visit: Payer: Self-pay | Admitting: Obstetrics and Gynecology

## 2014-04-28 ENCOUNTER — Ambulatory Visit (AMBULATORY_SURGERY_CENTER): Payer: BC Managed Care – PPO | Admitting: Internal Medicine

## 2014-04-28 ENCOUNTER — Encounter: Payer: Self-pay | Admitting: Internal Medicine

## 2014-04-28 DIAGNOSIS — Z1211 Encounter for screening for malignant neoplasm of colon: Secondary | ICD-10-CM

## 2014-04-28 MED ORDER — SODIUM CHLORIDE 0.9 % IV SOLN: 500.0000 mL | INTRAVENOUS | Status: DC

## 2014-04-28 NOTE — Progress Notes (Signed)
Report to PACU, RN, vss, BBS= Clear.  

## 2014-04-28 NOTE — Patient Instructions (Addendum)
Your colonoscopy was normal!  Next routine colonoscopy in 10 years - 2025  Please try the measures to help your IBS.  I appreciate the opportunity to care for you. Gatha Mayer, MD, FACGYOU HAD AN ENDOSCOPIC PROCEDURE TODAY AT [facility]: Refer to the procedure report that was given to you for any specific questions about what was found during the examination.  If the procedure report does not answer your questions, please call your gastroenterologist to clarify.  If you requested that your care partner not be given the details of your procedure findings, then the procedure report has been included in a sealed envelope for you to review at your convenience later.  YOU SHOULD EXPECT: Some feelings of bloating in the abdomen. Passage of more gas than usual.  Walking can help get rid of the air that was put into your GI tract during the procedure and reduce the bloating. If you had a lower endoscopy (such as a colonoscopy or flexible sigmoidoscopy) you may notice spotting of blood in your stool or on the toilet paper. If you underwent a bowel prep for your procedure, then you may not have a normal bowel movement for a few days.  DIET: Your first meal following the procedure should be a light meal and then it is ok to progress to your normal diet.  A half-sandwich or bowl of soup is an example of a good first meal.  Heavy or fried foods are harder to digest and may make you feel nauseous or bloated.  Likewise meals heavy in dairy and vegetables can cause extra gas to form and this can also increase the bloating.  Drink plenty of fluids but you should avoid alcoholic beverages for 24 hours.  ACTIVITY: Your care partner should take you home directly after the procedure.  You should plan to take it easy, moving slowly for the rest of the day.  You can resume normal activity the day after the procedure however you should NOT DRIVE or use heavy machinery for 24 hours (because of the  sedation medicines used during the test).    SYMPTOMS TO REPORT IMMEDIATELY: A gastroenterologist can be reached at any hour.  During normal business hours, 8:30 AM to 5:00 PM Monday through Friday, call [phone].  After hours and on weekends, please call the GI answering service at [phone] who will take a message and have the physician on call contact you.   Following lower endoscopy (colonoscopy or flexible sigmoidoscopy):  Excessive amounts of blood in the stool  Significant tenderness or worsening of abdominal pains  Swelling of the abdomen that is new, acute  Fever of 100F or higher  Following upper endoscopy (EGD)  Vomiting of blood or coffee ground material  New chest pain or pain under the shoulder blades  Painful or persistently difficult swallowing  New shortness of breath  Fever of 100F or higher  Black, tarry-looking stools  FOLLOW UP: If any biopsies were taken you will be contacted by phone or by letter within the next 1-3 weeks.  Call your gastroenterologist if you have not heard about the biopsies in 3 weeks.  Our staff will call the home number listed on your records the next business day following your procedure to check on you and address any questions or concerns that you may have at that time regarding the information given to you following your procedure. This is a courtesy call and so if there is no answer at  the home number and we have not heard from you through the emergency physician on call, we will assume that you have returned to your regular daily activities without incident.  SIGNATURES/CONFIDENTIALITY: You and/or your care partner have signed paperwork which will be entered into your electronic medical record.  These signatures attest to the fact that that the information above on your After Visit Summary has been reviewed and is understood.  Full responsibility of the confidentiality of this discharge information lies with you and/or your  care-partner.

## 2014-04-28 NOTE — Op Note (Signed)
[place]  [address] [place], [id number]   COLONOSCOPY PROCEDURE REPORT  PATIENT: Crystal Norton, Crystal Norton  MR#: [id number] BIRTHDATE: 1965-10-13 , 47  yrs. old GENDER: female ENDOSCOPIST: Gatha Mayer, MD, [facility] PROCEDURE DATE:  04/28/2014 PROCEDURE:   Colonoscopy, screening First Screening Colonoscopy - Avg.  risk and is 50 yrs.  old or older Yes.  Prior Negative Screening - Now for repeat screening. N/A  History of Adenoma - Now for follow-up colonoscopy & has been > or = to 3 yrs.  N/A  Polyps Removed Today? No.  Polyps Removed Today? No.  Recommend repeat exam, <10 yrs? Polyps Removed Today? No.  Recommend repeat exam, <10 yrs? No. ASA CLASS:   Class II INDICATIONS:average risk for colorectal cancer and first colonoscopy. MEDICATIONS: Propofol 200 mg IV and Monitored anesthesia care  DESCRIPTION OF PROCEDURE:   After the risks benefits and alternatives of the procedure were thoroughly explained, informed consent was obtained.  The digital rectal exam revealed no abnormalities of the rectum.   The [id number]  endoscope was introduced through the anus and advanced to the cecum, which was identified by both the appendix and ileocecal valve. No adverse events experienced.   The quality of the prep was excellent, using MiraLax  The instrument was then slowly withdrawn as the colon was fully examined.      COLON FINDINGS: A normal appearing cecum, ileocecal valve, and appendiceal orifice were identified.  The ascending, transverse, descending, sigmoid colon, and rectum appeared unremarkable. Retroflexed views revealed no abnormalities. The time to cecum=3 minutes 11 seconds.  Withdrawal time=8 minutes 37 seconds.  The scope was withdrawn and the procedure completed. COMPLICATIONS: There were no immediate complications.  ENDOSCOPIC IMPRESSION: Normal colonoscopy  RECOMMENDATIONS: 1.  Repeat colonoscopy 10 years. 2.  Try IBS treatment steps  discussed at prior office visit and schedule F/U PRN  eSigned:  Gatha Mayer, MD, [facility] 04/28/2014 2:34 PM   cc: Glendale Chard, MD and The Patient

## 2014-04-29 ENCOUNTER — Telehealth: Payer: Self-pay | Admitting: *Deleted

## 2014-04-29 ENCOUNTER — Encounter (HOSPITAL_COMMUNITY): Payer: Self-pay

## 2014-04-29 NOTE — Telephone Encounter (Signed)
  Follow up Call-  Call back number 04/28/2014  Post procedure Call Back phone  # [phone] hm  Permission to leave phone message Yes     Patient questions:  Do you have a fever, pain , or abdominal swelling? No. Pain Score  0 *  Have you tolerated food without any problems? Yes.    Have you been able to return to your normal activities? Yes.    Do you have any questions about your discharge instructions: Diet   No. Medications  No. Follow up visit  No.  Do you have questions or concerns about your Care? No.  Actions: * If pain score is 4 or above: No action needed, pain <4.

## 2014-05-09 ENCOUNTER — Encounter (HOSPITAL_COMMUNITY): Payer: Self-pay | Admitting: Obstetrics and Gynecology

## 2014-05-09 ENCOUNTER — Ambulatory Visit (HOSPITAL_COMMUNITY)
Admission: RE | Admit: 2014-05-09 | Discharge: 2014-05-09 | Disposition: A | Discharge status: Home / Self Care | Payer: BC Managed Care – PPO | Source: Ambulatory Visit | Attending: Obstetrics and Gynecology | Admitting: Obstetrics and Gynecology

## 2014-05-09 ENCOUNTER — Ambulatory Visit (HOSPITAL_COMMUNITY): Payer: BC Managed Care – PPO | Admitting: Anesthesiology

## 2014-05-09 ENCOUNTER — Encounter (HOSPITAL_COMMUNITY)
Admission: RE | Disposition: A | Discharge status: Home / Self Care | Payer: Self-pay | Source: Ambulatory Visit | Attending: Obstetrics and Gynecology

## 2014-05-09 DIAGNOSIS — R51 Headache: Secondary | ICD-10-CM | POA: Insufficient documentation

## 2014-05-09 DIAGNOSIS — R922 Inconclusive mammogram: Secondary | ICD-10-CM | POA: Diagnosis not present

## 2014-05-09 DIAGNOSIS — Z800 Family history of malignant neoplasm of digestive organs: Secondary | ICD-10-CM | POA: Insufficient documentation

## 2014-05-09 DIAGNOSIS — N923 Ovulation bleeding: Secondary | ICD-10-CM | POA: Insufficient documentation

## 2014-05-09 DIAGNOSIS — N939 Abnormal uterine and vaginal bleeding, unspecified: Secondary | ICD-10-CM | POA: Diagnosis present

## 2014-05-09 DIAGNOSIS — K589 Irritable bowel syndrome without diarrhea: Secondary | ICD-10-CM | POA: Diagnosis not present

## 2014-05-09 DIAGNOSIS — N6012 Diffuse cystic mastopathy of left breast: Secondary | ICD-10-CM | POA: Insufficient documentation

## 2014-05-09 DIAGNOSIS — Z803 Family history of malignant neoplasm of breast: Secondary | ICD-10-CM | POA: Diagnosis not present

## 2014-05-09 DIAGNOSIS — Z8249 Family history of ischemic heart disease and other diseases of the circulatory system: Secondary | ICD-10-CM | POA: Diagnosis not present

## 2014-05-09 DIAGNOSIS — D259 Leiomyoma of uterus, unspecified: Secondary | ICD-10-CM | POA: Diagnosis present

## 2014-05-09 DIAGNOSIS — N840 Polyp of corpus uteri: Secondary | ICD-10-CM | POA: Insufficient documentation

## 2014-05-09 DIAGNOSIS — B009 Herpesviral infection, unspecified: Secondary | ICD-10-CM | POA: Diagnosis not present

## 2014-05-09 DIAGNOSIS — Z833 Family history of diabetes mellitus: Secondary | ICD-10-CM | POA: Diagnosis not present

## 2014-05-09 DIAGNOSIS — Z823 Family history of stroke: Secondary | ICD-10-CM | POA: Insufficient documentation

## 2014-05-09 HISTORY — PX: DILATATION & CURRETTAGE/HYSTEROSCOPY WITH RESECTOCOPE: SHX5572

## 2014-05-09 HISTORY — DX: Headache, unspecified: R51.9

## 2014-05-09 HISTORY — DX: Headache: R51

## 2014-05-09 LAB — CBC
HEMATOCRIT: 37.4 % (ref 36.0–46.0)
Hemoglobin: 11.9 g/dL — ABNORMAL LOW (ref 12.0–15.0)
MCH: 28.5 pg (ref 26.0–34.0)
MCHC: 31.8 g/dL (ref 30.0–36.0)
MCV: 89.7 fL (ref 78.0–100.0)
PLATELETS: 198 10*3/uL (ref 150–400)
RBC: 4.17 MIL/uL (ref 3.87–5.11)
RDW: 12.1 % (ref 11.5–15.5)
WBC: 3.7 10*3/uL — ABNORMAL LOW (ref 4.0–10.5)

## 2014-05-09 LAB — PREGNANCY, URINE: Preg Test, Ur: NEGATIVE

## 2014-05-09 SURGERY — DILATATION & CURETTAGE/HYSTEROSCOPY WITH RESECTOCOPE
Anesthesia: General | Site: Vagina

## 2014-05-09 MED ORDER — HYDROCODONE-ACETAMINOPHEN 5-325 MG PO TABS: 1.0000 | ORAL_TABLET | Freq: Four times a day (QID) | ORAL | Status: DC | PRN

## 2014-05-09 MED ORDER — FENTANYL CITRATE 0.05 MG/ML IJ SOLN: 25.0000 ug | INTRAMUSCULAR | Status: DC | PRN

## 2014-05-09 MED ORDER — PROPOFOL 10 MG/ML IV BOLUS
INTRAVENOUS | Status: DC | PRN
  Administered 2014-05-09: 170 mg via INTRAVENOUS

## 2014-05-09 MED ORDER — DEXAMETHASONE SODIUM PHOSPHATE 10 MG/ML IJ SOLN
INTRAMUSCULAR | Status: DC | PRN
  Administered 2014-05-09: 10 mg via INTRAVENOUS

## 2014-05-09 MED ORDER — KETOROLAC TROMETHAMINE 30 MG/ML IJ SOLN
INTRAMUSCULAR | Status: DC | PRN
  Administered 2014-05-09: 30 mg via INTRAVENOUS

## 2014-05-09 MED ORDER — MIDAZOLAM HCL 2 MG/2ML IJ SOLN
INTRAMUSCULAR | Status: DC | PRN
  Administered 2014-05-09: 2 mg via INTRAVENOUS

## 2014-05-09 MED ORDER — ONDANSETRON HCL 4 MG/2ML IJ SOLN
INTRAMUSCULAR | Status: DC | PRN
Start: 2014-05-09 — End: 2014-05-09
  Administered 2014-05-09: 4 mg via INTRAVENOUS

## 2014-05-09 MED ORDER — IBUPROFEN 600 MG PO TABS: ORAL_TABLET | ORAL | Status: DC

## 2014-05-09 MED ORDER — GLYCINE 1.5 % IR SOLN
Status: DC | PRN
  Administered 2014-05-09: 3000 mL

## 2014-05-09 MED ORDER — SCOPOLAMINE 1 MG/3DAYS TD PT72
1.0000 | MEDICATED_PATCH | Freq: Once | TRANSDERMAL | Status: DC
  Administered 2014-05-09: 1.5 mg via TRANSDERMAL

## 2014-05-09 MED ORDER — FENTANYL CITRATE 0.05 MG/ML IJ SOLN
INTRAMUSCULAR | Status: DC | PRN
  Administered 2014-05-09: 50 ug via INTRAVENOUS
  Administered 2014-05-09: 25 ug via INTRAVENOUS
  Administered 2014-05-09: 50 ug via INTRAVENOUS
  Administered 2014-05-09: 25 ug via INTRAVENOUS
  Administered 2014-05-09: 50 ug via INTRAVENOUS

## 2014-05-09 MED ORDER — LACTATED RINGERS IV SOLN
INTRAVENOUS | Status: DC
  Administered 2014-05-09 (×2): via INTRAVENOUS

## 2014-05-09 MED ORDER — LIDOCAINE HCL 2 % IJ SOLN
INTRAMUSCULAR | Status: DC | PRN
  Administered 2014-05-09: 10 mL

## 2014-05-09 MED ORDER — SODIUM CHLORIDE 0.9 % IR SOLN
Status: DC | PRN
  Administered 2014-05-09 (×2): 3000 mL

## 2014-05-09 MED ORDER — LIDOCAINE HCL (CARDIAC) 20 MG/ML IV SOLN
INTRAVENOUS | Status: DC | PRN
  Administered 2014-05-09: 80 mg via INTRAVENOUS

## 2014-05-09 SURGICAL SUPPLY — 26 items
BOOTIES KNEE HIGH SLOAN (MISCELLANEOUS) ×4 IMPLANT
CANISTER SUCT 3000ML (MISCELLANEOUS) ×2 IMPLANT
CATH ROBINSON RED A/P 16FR (CATHETERS) ×2 IMPLANT
CLOTH BEACON ORANGE TIMEOUT ST (SAFETY) ×2 IMPLANT
CONTAINER PREFILL 10% NBF 60ML (FORM) ×3 IMPLANT
CORD ACTIVE DISPOSABLE (ELECTRODE)
CORD ELECTRO ACTIVE DISP (ELECTRODE) ×1 IMPLANT
DEVICE MYOSURE CLASSIC (MISCELLANEOUS) ×1 IMPLANT
DILATOR CANAL MILEX (MISCELLANEOUS) ×1 IMPLANT
ELECT LOOP GYNE PRO 24FR (CUTTING LOOP)
ELECT REM PT RETURN 9FT ADLT (ELECTROSURGICAL)
ELECT VAPORTRODE GRVD BAR (ELECTRODE) IMPLANT
ELECTRODE LOOP GYNE PRO 24FR (CUTTING LOOP) IMPLANT
ELECTRODE REM PT RTRN 9FT ADLT (ELECTROSURGICAL) ×1 IMPLANT
ELECTRODE ROLLER VERSAPOINT (ELECTRODE) IMPLANT
ELECTRODE RT ANGLE VERSAPOINT (CUTTING LOOP) IMPLANT
ELECTRODE TWIZZLE TIP (MISCELLANEOUS) IMPLANT
GLOVE SURG SS PI 6.5 STRL IVOR (GLOVE) ×4 IMPLANT
GOWN STRL REUS W/TWL LRG LVL3 (GOWN DISPOSABLE) ×4 IMPLANT
LOOP ANGLED CUTTING 22FR (CUTTING LOOP) ×1 IMPLANT
PACK VAGINAL MINOR WOMEN LF (CUSTOM PROCEDURE TRAY) ×2 IMPLANT
PAD OB MATERNITY 4.3X12.25 (PERSONAL CARE ITEMS) ×2 IMPLANT
TOWEL OR 17X24 6PK STRL BLUE (TOWEL DISPOSABLE) ×4 IMPLANT
TUBING AQUILEX INFLOW (TUBING) ×3 IMPLANT
TUBING AQUILEX OUTFLOW (TUBING) ×2 IMPLANT
WATER STERILE IRR 1000ML POUR (IV SOLUTION) ×2 IMPLANT

## 2014-05-09 NOTE — Transfer of Care (Signed)
Immediate Anesthesia Transfer of Care Note  Patient: Crystal Norton  Procedure(s) Performed: Procedure(s): DILATATION & CURETTAGE/HYSTEROSCOPY WITH RESECTOCOPE (N/A)  Patient Location: PACU  Anesthesia Type:General  Level of Consciousness: awake  Airway & Oxygen Therapy: Patient Spontanous Breathing  Post-op Assessment: Report given to PACU RN  Post vital signs: stable  Filed Vitals:   05/09/14 1256  BP: 118/81  Pulse: 102  Temp: 36.9 C  Resp: 16    Complications: No apparent anesthesia complications

## 2014-05-09 NOTE — H&P (Signed)
Crystal Norton is an 48 y.o. female. For evaluation of endometrial lesion and prolonged menses.  Pertinent Gynecological History: Menses: regular every month with spotting approximately 10 days per month Bleeding: intermenstrual bleeding Contraception: abstinence Blood transfusions: none Sexually transmitted diseases: HSV II Previous GYN Procedures: none  Last pap: normal Date: 2014 OB History: G0   Menstrual History: Menarche age: 32 Patient's last menstrual period was 04/15/2014.    Past Medical History  Diagnosis Date  . HSV-2 (herpes simplex virus 2) infection   . Breast asymmetry   . IBS (irritable bowel syndrome) 03/05/2014  . Allergy   . Headache     Past Surgical History  Procedure Laterality Date  . Wisdom tooth extraction    . Needle biopsy of breast    . Colonoscopy      Family History  Problem Relation Age of Onset  . Hypertension Mother   . Hypertension Father   . Diabetes Father   . Heart disease Father   . Breast cancer Paternal Aunt   . Breast cancer Paternal Aunt   . Stroke    . Heart disease    . Diabetes    . Colon cancer    . Esophageal cancer Neg Hx   . Pancreatic cancer Neg Hx   . Rectal cancer Neg Hx   . Stomach cancer Neg Hx     Social History:  reports that she has never smoked. She has never used smokeless tobacco. She reports that she does not drink alcohol or use illicit drugs.  Allergies: No Known Allergies  Prescriptions prior to admission  Medication Sig Dispense Refill Last Dose  . Cholecalciferol (VITAMIN D PO) Take 1 tablet by mouth daily.     Marland Kitchen LYSINE PO Take 1 tablet by mouth daily.     . Multiple Vitamin (MULTIVITAMIN WITH MINERALS) TABS tablet Take 1 tablet by mouth daily.     . valACYclovir (VALTREX) 500 MG tablet Take 1 tablet (500 mg total) by mouth 2 (two) times daily. (Patient taking differently: Take 500 mg by mouth 2 (two) times daily as needed (for breakouts). ) 60 tablet 12 Past Month at Unknown time     Review of Systems  Constitutional: Negative.   HENT: Negative.   Eyes: Negative.   Respiratory: Negative.   Cardiovascular: Negative.   Gastrointestinal: Negative.   Genitourinary: Negative.   Musculoskeletal: Negative.   Skin: Negative.   Neurological: Negative.   Psychiatric/Behavioral: Negative.     Blood pressure 118/81, pulse 102, temperature 98.4 F (36.9 C), temperature source Oral, resp. rate 16, last menstrual period 04/15/2014, SpO2 100 %. Physical Exam  Constitutional: She is oriented to person, place, and time. She appears well-developed and well-nourished.  HENT:  Head: Normocephalic and atraumatic.  Eyes: Conjunctivae and EOM are normal.  Neck: Normal range of motion. Neck supple.  Cardiovascular: Normal rate and regular rhythm.   Respiratory: Effort normal and breath sounds normal.  GI: Soft. Bowel sounds are normal.  Genitourinary:  Pelvic exam: VULVA: normal appearing vulva with no masses, tenderness or lesions, VAGINA: normal appearing vagina with normal color and discharge, no lesions, CERVIX: normal appearing cervix without discharge or lesions, UTERUS: upper limits nl size, ADNEXA: normal adnexa in size, nontender and no masses, RECTAL: normal rectal, no masses, deferred.   Musculoskeletal: Normal range of motion.  Neurological: She is alert and oriented to person, place, and time.  Skin: Skin is warm and dry.  Psychiatric: She has a normal mood and affect.  Results for orders placed or performed during the hospital encounter of 05/09/14 (from the past 24 hour(s))  Pregnancy, urine     Status: None   Collection Time: 05/09/14 12:30 PM  Result Value Ref Range   Preg Test, Ur NEGATIVE NEGATIVE  CBC     Status: Abnormal   Collection Time: 05/09/14 12:50 PM  Result Value Ref Range   WBC 3.7 (L) 4.0 - 10.5 K/uL   RBC 4.17 3.87 - 5.11 MIL/uL   Hemoglobin 11.9 (L) 12.0 - 15.0 g/dL   HCT 37.4 36.0 - 46.0 %   MCV 89.7 78.0 - 100.0 fL   MCH 28.5 26.0  - 34.0 pg   MCHC 31.8 30.0 - 36.0 g/dL   RDW 12.1 11.5 - 15.5 %   Platelets 198 150 - 400 K/uL    Office ultrasound shows a less than 2 cm myometrial fibroid.  SHG shows an entracavitary lesion polyp vs fibroid. Assessment/Plan: Crystal Norton  has presented today for surgery, with the diagnosis of Endometrial Mass  The various methods of treatment have been discussed with the patient and family. After consideration of risks, benefits and other options for treatment, the patient has consented to  Procedure(s): [place] as a surgical intervention .  I have reviewed the patients' chart and labs.  Questions were answered to the patient's satisfaction.       05/09/2014   1:46 PM    Yong Wahlquist P  MD    Elois Averitt P 05/09/2014, 1:41 PM

## 2014-05-09 NOTE — Anesthesia Postprocedure Evaluation (Signed)
  Anesthesia Post-op Note  Patient: Crystal Norton  Procedure(s) Performed: Procedure(s): DILATATION & CURETTAGE/HYSTEROSCOPY WITH RESECTOCOPE (N/A)  Patient Location: PACU  Anesthesia Type:General  Level of Consciousness: awake, alert  and oriented  Airway and Oxygen Therapy: Patient Spontanous Breathing  Post-op Pain: none  Post-op Assessment: Post-op Vital signs reviewed, Patient's Cardiovascular Status Stable, Respiratory Function Stable, Patent Airway, No signs of Nausea or vomiting and Pain level controlled  Post-op Vital Signs: Reviewed and stable  Last Vitals:  Filed Vitals:   05/09/14 1630  BP: 134/76  Pulse: 69  Temp:   Resp: 15    Complications: No apparent anesthesia complications

## 2014-05-09 NOTE — Discharge Instructions (Signed)
Hysteroscopy, Care After Refer to this sheet in the next few weeks. These instructions provide you with information on caring for yourself after your procedure. Your health care provider may also give you more specific instructions. Your treatment has been planned according to current medical practices, but problems sometimes occur. Call your health care provider if you have any problems or questions after your procedure.   No Ibuprofen containing products (ie Advil, Aleve, Motrin, etc) until after 10:00 pm tonight.   WHAT TO EXPECT AFTER THE PROCEDURE After your procedure, it is typical to have the following:  You may have some cramping. This normally lasts for a couple days.  You may have bleeding. This can vary from light spotting for a few days to menstrual-like bleeding for 3-7 days. HOME CARE INSTRUCTIONS  Rest for the first 1-2 days after the procedure.  Only take over-the-counter or prescription medicines as directed by your health care provider. Do not take aspirin. It can increase the chances of bleeding.  Take showers instead of baths for 2 weeks or as directed by your health care provider.  Do not drive for 24 hours or as directed.  Do not drink alcohol while taking pain medicine.  Do not use tampons, douche, or have sexual intercourse for 2 weeks or until your health care provider says it is okay.  Take your temperature twice a day for 4-5 days. Write it down each time.  Follow your health care provider's advice about diet, exercise, and lifting.  If you develop constipation, you may:  Take a mild laxative if your health care provider approves.  Add bran foods to your diet.  Drink enough fluids to keep your urine clear or pale yellow.  Try to have someone with you or available to you for the first 24-48 hours, especially if you were given a general anesthetic.  Follow up with your health care provider as directed. SEEK MEDICAL CARE IF:  You feel dizzy or  lightheaded.  You feel sick to your stomach (nauseous).  You have abnormal vaginal discharge.  You have a rash.  You have pain that is not controlled with medicine. SEEK IMMEDIATE MEDICAL CARE IF:  You have bleeding that is heavier than a normal menstrual period.  You have a fever.  You have increasing cramps or pain, not controlled with medicine.  You have new belly (abdominal) pain.  You pass out.  You have pain in the tops of your shoulders (shoulder strap areas).  You have shortness of breath. Document Released: 03/13/2013 Document Reviewed: 03/13/2013 [facility] Patient Information 2015 [place], [place]. This information is not intended to replace advice given to you by your health care provider. Make sure you discuss any questions you have with your health care provider.

## 2014-05-09 NOTE — Anesthesia Preprocedure Evaluation (Signed)

## 2014-05-12 ENCOUNTER — Telehealth: Payer: Self-pay | Admitting: Internal Medicine

## 2014-05-12 ENCOUNTER — Encounter (HOSPITAL_COMMUNITY): Payer: Self-pay | Admitting: Obstetrics and Gynecology

## 2014-05-12 NOTE — Telephone Encounter (Signed)
Dr. Carlean Purl is it ok to give note for 04/28/14 and 04/29/14.  Her procedure was 04/28/14

## 2014-05-12 NOTE — Telephone Encounter (Signed)
Patient notified letter is ready

## 2014-05-12 NOTE — Telephone Encounter (Signed)
yes

## 2014-05-12 NOTE — Op Note (Signed)
05/09/2014  12:20 PM  PATIENT:  Crystal Norton  48 y.o. female  PRE-OPERATIVE DIAGNOSIS:  Endometrial Mass  POST-OPERATIVE DIAGNOSIS:  Endometrial Mass  PROCEDURE:  Procedure(s): DILATATION & CURETTAGE/HYSTEROSCOPY WITH RESECTOCOPE  SURGEON:  Keysean Savino P, MD  ASSISTANTS: none  ANESTHESIA:   general  ESTIMATED BLOOD LOSS: Less than 100 cc  BLOOD ADMINISTERED:none  COMPLICATIONS:none  FINDINGS: There was a 2 cm polypoid lesion originating at the fundus.  Both tubal ostia were visualized . The remainder of the endometrium was atrophic  FLUID DEFICIT:650cc  LOCAL MEDICATIONS USED:  LIDOCAINE  and Amount: 10 ml  SPECIMEN:  Source of Specimen:  endometrial lesion, probable polyp.  Endometrial curettings  DISPOSITION OF SPECIMEN:  PATHOLOGY  COUNTS:  YES  DESCRIPTION OF PROCEDURE:the patient was taken to the operating room after appropriate identification and placed on the operating table. After the attainment of adequate general anesthesia she was placed in the lithotomy position. The perineum and vagina were prepped with multiple layers of Betadine. The bladder was emptied with a an in and out catheter. The perineum was draped in sterile field. A gray speculum was placed in the vagina. The cervix was grasped with a single-tooth tenaculum. A paracervical block was achieved with a total of 10 cc of 2% Xylocaine and the 5 and 7:00 positions. The uterus was sounded to 8 cm  The cervix was then dilated to accommodate the diagnostic hysteroscope. The hysteroscope was used to evaluate all quadrants of the uterus with the above noted findings. The myosure cutting intstrument was then placed and resection of the polypoid lesion begun.  Visibility became difficult, presumably because of outflow suction that attracted resection specimens into the scope, so the resection was completed with the resectoscope after flushing the uterine cavity with glycine as the new distension medium.  The  sharp curette was used to remove the resection pieces.  The hysteroscope was reintroduced to visualize a normal endometrial cavity and adequate hemostasis. All instruments were then removed from the vagina and the patient was awakened from general anesthesia and taken to the recovery room in satisfactory condition having tolerated the procedure well sponge and instrument counts correct.  PLAN OF CARE: Discharge home after PACU care  PATIENT DISPOSITION:  PACU - hemodynamically stable.   Delay start of Pharmacological VTE agent (>24hrs) due to surgical blood loss or risk of bleeding:  not applicable.  SCD hose were used during the procedure.   Eldred Manges, MD 12:20 PM

## 2014-05-16 NOTE — Addendum Note (Signed)
Addendum  created 05/16/14 1928 by Venia Carbon. Royce Macadamia, MD   Modules edited: Anesthesia Attestations

## 2014-09-30 ENCOUNTER — Other Ambulatory Visit: Payer: Self-pay

## 2014-09-30 DIAGNOSIS — Z1231 Encounter for screening mammogram for malignant neoplasm of breast: Secondary | ICD-10-CM

## 2014-10-27 ENCOUNTER — Ambulatory Visit
Admission: RE | Admit: 2014-10-27 | Discharge: 2014-10-27 | Disposition: A | Discharge status: Home / Self Care | Payer: BC Managed Care – PPO | Source: Ambulatory Visit

## 2014-10-27 DIAGNOSIS — Z1231 Encounter for screening mammogram for malignant neoplasm of breast: Secondary | ICD-10-CM

## 2016-02-29 ENCOUNTER — Ambulatory Visit: Payer: Self-pay | Admitting: Internal Medicine

## 2016-11-04 ENCOUNTER — Encounter (HOSPITAL_COMMUNITY): Payer: Self-pay

## 2016-11-04 ENCOUNTER — Other Ambulatory Visit: Payer: Self-pay | Admitting: Obstetrics and Gynecology

## 2016-11-04 DIAGNOSIS — N939 Abnormal uterine and vaginal bleeding, unspecified: Secondary | ICD-10-CM | POA: Insufficient documentation

## 2016-11-04 DIAGNOSIS — Z79899 Other long term (current) drug therapy: Secondary | ICD-10-CM | POA: Insufficient documentation

## 2016-11-04 DIAGNOSIS — N938 Other specified abnormal uterine and vaginal bleeding: Secondary | ICD-10-CM | POA: Diagnosis present

## 2016-11-04 NOTE — ED Triage Notes (Signed)
Pt complaining of vaginal bleeding. Pt states had hysteroscopy to remove a polyp. Pt states believes she was moving around too much. Pt denies any vaginal pain. Pt states some cramping.

## 2016-11-05 ENCOUNTER — Emergency Department (HOSPITAL_COMMUNITY)
Admission: EM | Admit: 2016-11-05 | Discharge: 2016-11-05 | Disposition: A | Discharge status: Home / Self Care | Payer: BC Managed Care – PPO | Attending: Emergency Medicine | Admitting: Emergency Medicine

## 2016-11-05 DIAGNOSIS — N939 Abnormal uterine and vaginal bleeding, unspecified: Secondary | ICD-10-CM

## 2016-11-05 LAB — BASIC METABOLIC PANEL
Anion gap: 9 (ref 5–15)
BUN: 14 mg/dL (ref 6–20)
CO2: 23 mmol/L (ref 22–32)
CREATININE: 0.9 mg/dL (ref 0.44–1.00)
Calcium: 9.2 mg/dL (ref 8.9–10.3)
Chloride: 107 mmol/L (ref 101–111)
GFR calc Af Amer: >60 mL/min (ref >60)
GFR calc non Af Amer: >60 mL/min (ref >60)
Glucose, Bld: 105 mg/dL — ABNORMAL HIGH (ref 65–99)
Potassium: 3.9 mmol/L (ref 3.5–5.1)
SODIUM: 139 mmol/L (ref 135–145)

## 2016-11-05 LAB — CBC
HCT: 35.6 % — ABNORMAL LOW (ref 36.0–46.0)
Hemoglobin: 10.9 g/dL — ABNORMAL LOW (ref 12.0–15.0)
MCH: 27.1 pg (ref 26.0–34.0)
MCHC: 30.6 g/dL (ref 30.0–36.0)
MCV: 88.6 fL (ref 78.0–100.0)
Platelets: 203 10*3/uL (ref 150–400)
RBC: 4.02 MIL/uL (ref 3.87–5.11)
RDW: 12.7 % (ref 11.5–15.5)
WBC: 4.2 10*3/uL (ref 4.0–10.5)

## 2016-11-05 LAB — I-STAT BETA HCG BLOOD, ED (MC, WL, AP ONLY): I-stat hCG, quantitative: <5 m[IU]/mL (ref <5)

## 2016-11-05 NOTE — ED Provider Notes (Signed)
[facility] DEPT Provider Note   CSN: [id number] Arrival date & time: 11/04/16  2248 By signing my name below, I, Dyke Brackett, attest that this documentation has been prepared under the direction and in the presence of Jola Schmidt, MD . Electronically Signed: Dyke Brackett, Scribe. 11/05/2016. 2:19 AM.   History   Chief Complaint Chief Complaint  Patient presents with  . Vaginal Bleeding   HPI Crystal Norton is a 51 y.o. female with a history of abnormal uterine bleeding, uterine fibroids, and IBS who presents to the Emergency Department complaining of gradually improving vaginal bleeding onset today. Pt states she had a dilation and curettage/hysteroscopy today to remove uterine polyps and is now experiencing vaginal bleeding. Per pt, she has had this procedure in the past and did not experience bleeding. Pt states she has been active today, and feels like this caused her vaginal bleeding. She reports associated increased gassiness. Pt has taken Ibuprofen tonight with moderate relief of symptoms.  Pt denies any vaginal or abdominal pain.   The history is provided by the patient. No language interpreter was used.   Past Medical History:  Diagnosis Date  . Allergy   . Anxiety and depression   . Breast asymmetry   . Headache   . HSV-2 (herpes simplex virus 2) infection   . Hyperlipidemia   . IBS (irritable bowel syndrome) 03/05/2014  . Prediabetes     Patient Active Problem List   Diagnosis Date Noted  . Abnormal uterine bleeding 05/09/2014  . Fibroid, uterine 05/09/2014  . IBS (irritable bowel syndrome) 03/05/2014  . Dense breasts 06/06/2012  . Cyst of breast, left, diffuse fibrocystic 01/19/2012  . CEPHALGIA 01/22/2009    Past Surgical History:  Procedure Laterality Date  . COLONOSCOPY    . DILATATION & CURRETTAGE/HYSTEROSCOPY WITH RESECTOCOPE N/A 05/09/2014   Procedure: [place];  Surgeon: Eldred Manges, MD;  Location:  [facility];  Service: Gynecology;  Laterality: N/A;  . needle biopsy of breast    . WISDOM TOOTH EXTRACTION      OB History    Gravida Para Term Preterm AB Living   0 0 0 0 0 0   SAB TAB Ectopic Multiple Live Births   0 0 0 0         Home Medications    Prior to Admission medications   Medication Sig Start Date End Date Taking? Authorizing Provider  Cholecalciferol (VITAMIN D PO) Take 1 tablet by mouth daily.    [provider]  HYDROcodone-acetaminophen (NORCO/VICODIN) 5-325 MG per tablet Take 1 tablet by mouth every 6 (six) hours as needed for moderate pain or severe pain. 05/09/14   Haygood, Seymour Bars, MD  ibuprofen (ADVIL,MOTRIN) 600 MG tablet One table evey 6 hours for 3 days then every 6 hrs as needed for mild pain 05/09/14   Haygood, Seymour Bars, MD  LYSINE PO Take 1 tablet by mouth daily.    [provider]  Multiple Vitamin (MULTIVITAMIN WITH MINERALS) TABS tablet Take 1 tablet by mouth daily.    [provider]  valACYclovir (VALTREX) 500 MG tablet Take 1 tablet (500 mg total) by mouth 2 (two) times daily. Patient taking differently: Take 500 mg by mouth 2 (two) times daily as needed (for breakouts).  01/19/12   Haygood, Seymour Bars, MD    Family History Family History  Problem Relation Age of Onset  . Hypertension Mother   . Hypertension Father   . Diabetes Father   .  Heart disease Father   . Colon cancer Unknown   . Breast cancer Paternal Aunt   . Breast cancer Paternal Aunt   . Stroke Unknown   . Heart disease Unknown   . Diabetes Unknown   . Esophageal cancer Neg Hx   . Pancreatic cancer Neg Hx   . Rectal cancer Neg Hx   . Stomach cancer Neg Hx     Social History Social History  Substance Use Topics  . Smoking status: Never Smoker  . Smokeless tobacco: Never Used  . Alcohol use No     Allergies   Patient has no known allergies.   Review of Systems Review of Systems All systems reviewed and are negative for acute change except  as noted in the HPI.  Physical Exam Updated Vital Signs BP 114/64   Pulse 73   Temp 98.4 F (36.9 C) (Oral)   Resp 16   LMP  (LMP Unknown)   SpO2 96%   Physical Exam  Constitutional: She is oriented to person, place, and time. She appears well-developed and well-nourished.  HENT:  Head: Normocephalic.  Eyes: EOM are normal.  Neck: Normal range of motion.  Pulmonary/Chest: Effort normal.  Abdominal: Soft. She exhibits no distension. There is no tenderness.  Musculoskeletal: Normal range of motion.  Neurological: She is alert and oriented to person, place, and time.  Psychiatric: She has a normal mood and affect.  Nursing note and vitals reviewed.   ED Treatments / Results  DIAGNOSTIC STUDIES:  Oxygen Saturation is 96% on RA, normal by my interpretation.    COORDINATION OF CARE:  2:18 AM Discussed treatment plan with pt at bedside and pt agreed to plan.   Labs (all labs ordered are listed, but only abnormal results are displayed) Labs Reviewed  CBC - Abnormal; Notable for the following:       Result Value   Hemoglobin 10.9 (*)    HCT 35.6 (*)    All other components within normal limits  BASIC METABOLIC PANEL - Abnormal; Notable for the following:    Glucose, Bld 105 (*)    All other components within normal limits  I-STAT BETA HCG BLOOD, ED ([facility], [facility], [facility] ONLY)    EKG  EKG Interpretation None       Radiology No results found.  Procedures Procedures (including critical care time)  Medications Ordered in ED Medications - No data to display   Initial Impression / Assessment and Plan / ED Course  I have reviewed the triage vital signs and the nursing notes.  Pertinent labs & imaging results that were available during my care of the patient were reviewed by me and considered in my medical decision making (see chart for details).     Patient feels like her bleeding is significantly resolving.  She feels much better at this time.  Her vital signs are  stable.  Her hemoglobin is stable.  I do not feel indication for additional workup at this time.  The patient is not interested in pelvic examination being performed.  I think this is reasonable.  Final Clinical Impressions(s) / ED Diagnoses   Final diagnoses:  Vaginal bleeding    New Prescriptions New Prescriptions   No medications on file   I personally performed the services described in this documentation, which was scribed in my presence. The recorded information has been reviewed and is accurate.        Jola Schmidt, MD 11/05/16 [phone]

## 2016-11-05 NOTE — Discharge Instructions (Signed)
Please contact your gynecologist for any additional recommendations or for worsening symptoms

## 2020-02-05 ENCOUNTER — Telehealth: Payer: Self-pay | Admitting: Internal Medicine

## 2020-02-05 NOTE — Telephone Encounter (Signed)
Patient reports a "warm sensation" on her right side.  She denies pain.  She is asking for an appt earlier.  She is scheduled for an office visit with Dr. Carlean Purl on 02/17/20.  She can't clarify the sensation any better.  She has been added to the cancellation list.  She is advised that if her symptoms worsen she should proceed to urgent care or ED

## 2020-02-17 ENCOUNTER — Ambulatory Visit: Payer: BC Managed Care – PPO | Admitting: Internal Medicine

## 2020-02-20 ENCOUNTER — Other Ambulatory Visit: Payer: Self-pay | Admitting: Obstetrics and Gynecology

## 2020-02-20 DIAGNOSIS — R928 Other abnormal and inconclusive findings on diagnostic imaging of breast: Secondary | ICD-10-CM

## 2020-02-26 ENCOUNTER — Other Ambulatory Visit: Payer: Self-pay

## 2020-02-26 ENCOUNTER — Ambulatory Visit
Admission: RE | Admit: 2020-02-26 | Discharge: 2020-02-26 | Disposition: A | Discharge status: Home / Self Care | Payer: BC Managed Care – PPO | Source: Ambulatory Visit | Attending: Obstetrics and Gynecology | Admitting: Obstetrics and Gynecology

## 2020-02-26 ENCOUNTER — Ambulatory Visit
Admission: RE | Admit: 2020-02-26 | Discharge: 2020-02-26 | Disposition: A | Discharge status: Home / Self Care | Payer: PRIVATE HEALTH INSURANCE | Source: Ambulatory Visit | Attending: Obstetrics and Gynecology | Admitting: Obstetrics and Gynecology

## 2020-02-26 DIAGNOSIS — R928 Other abnormal and inconclusive findings on diagnostic imaging of breast: Secondary | ICD-10-CM

## 2020-02-26 HISTORY — PX: BREAST BIOPSY: SHX20

## 2020-02-28 ENCOUNTER — Other Ambulatory Visit: Payer: Self-pay

## 2020-02-28 ENCOUNTER — Ambulatory Visit (LOCAL_COMMUNITY_HEALTH_CENTER): Payer: Self-pay

## 2020-02-28 DIAGNOSIS — Z111 Encounter for screening for respiratory tuberculosis: Secondary | ICD-10-CM

## 2020-02-28 DIAGNOSIS — Z23 Encounter for immunization: Secondary | ICD-10-CM

## 2020-03-02 ENCOUNTER — Other Ambulatory Visit: Payer: Self-pay

## 2020-03-02 ENCOUNTER — Ambulatory Visit (LOCAL_COMMUNITY_HEALTH_CENTER): Payer: Self-pay

## 2020-03-02 DIAGNOSIS — Z23 Encounter for immunization: Secondary | ICD-10-CM

## 2020-03-02 LAB — TB SKIN TEST
Induration: 0 mm
TB Skin Test: NEGATIVE

## 2020-03-02 NOTE — Progress Notes (Signed)
PPD 0 mm (negative ).Updated [facility] copy given per pt request. Pt reports aches x 1 day after Tdap 02/28/2020 and redness around Tdap injection site. RN counseled pt re: vaccine side effects and VIS for Tdap given and explained. Pt reports she has appt with PCP this afternoon and plans to discuss with her. Josie Saunders, RN

## 2020-05-07 ENCOUNTER — Ambulatory Visit: Payer: PRIVATE HEALTH INSURANCE

## 2020-05-23 ENCOUNTER — Ambulatory Visit: Payer: PRIVATE HEALTH INSURANCE

## 2020-05-26 ENCOUNTER — Ambulatory Visit: Payer: PRIVATE HEALTH INSURANCE | Attending: Internal Medicine

## 2020-05-26 DIAGNOSIS — Z23 Encounter for immunization: Secondary | ICD-10-CM

## 2020-05-26 NOTE — Progress Notes (Signed)
   Covid-19 Vaccination Clinic  Name:  Crystal Norton    MRN: [id number] DOB: 1965/09/08  05/26/2020  [name] was observed post Covid-19 immunization for 15 minutes without incident. She was provided with Vaccine Information Sheet and instruction to access the V-Safe system.   [name] was instructed to call 911 with any severe reactions post vaccine: Marland Kitchen Difficulty breathing  . Swelling of face and throat  . A fast heartbeat  . A bad rash all over body  . Dizziness and weakness   Immunizations Administered    Name Date Dose VIS Date Route   Moderna COVID-19 Vaccine 05/26/2020  4:06 PM 0.5 mL 03/25/2020 Intramuscular   Manufacturer: Moderna   Lot: [id number]   [place]unt: [id number]

## 2020-05-28 ENCOUNTER — Telehealth: Payer: Self-pay | Admitting: *Deleted

## 2020-05-28 NOTE — Telephone Encounter (Signed)
Injection site of booster from 05/26/20 with redness, swelling and warmth. Discussed with the patient this is normal for a few days. Care Advice including ice pack to area for 15 minutes twice today and tomorrow if needed. If red streak from injection site noticed call back or call pcp.

## 2021-01-01 ENCOUNTER — Other Ambulatory Visit: Payer: Self-pay | Admitting: Obstetrics and Gynecology

## 2021-01-01 DIAGNOSIS — N6489 Other specified disorders of breast: Secondary | ICD-10-CM

## 2021-01-09 ENCOUNTER — Other Ambulatory Visit: Payer: PRIVATE HEALTH INSURANCE

## 2021-01-12 ENCOUNTER — Other Ambulatory Visit: Payer: Self-pay | Admitting: Obstetrics and Gynecology

## 2021-01-12 ENCOUNTER — Ambulatory Visit
Admission: RE | Admit: 2021-01-12 | Discharge: 2021-01-12 | Disposition: A | Discharge status: Home / Self Care | Payer: BC Managed Care – PPO | Source: Ambulatory Visit | Attending: Obstetrics and Gynecology | Admitting: Obstetrics and Gynecology

## 2021-01-12 ENCOUNTER — Other Ambulatory Visit: Payer: Self-pay

## 2021-01-12 ENCOUNTER — Ambulatory Visit
Admission: RE | Admit: 2021-01-12 | Discharge: 2021-01-12 | Disposition: A | Discharge status: Home / Self Care | Payer: PRIVATE HEALTH INSURANCE | Source: Ambulatory Visit | Attending: Obstetrics and Gynecology | Admitting: Obstetrics and Gynecology

## 2021-01-12 DIAGNOSIS — N6489 Other specified disorders of breast: Secondary | ICD-10-CM

## 2021-01-15 ENCOUNTER — Other Ambulatory Visit: Payer: PRIVATE HEALTH INSURANCE

## 2021-07-19 ENCOUNTER — Other Ambulatory Visit: Payer: Self-pay | Admitting: Obstetrics and Gynecology

## 2021-07-19 ENCOUNTER — Other Ambulatory Visit: Payer: BC Managed Care – PPO

## 2021-07-19 ENCOUNTER — Ambulatory Visit
Admission: RE | Admit: 2021-07-19 | Discharge: 2021-07-19 | Disposition: A | Discharge status: Home / Self Care | Payer: BC Managed Care – PPO | Source: Ambulatory Visit | Attending: Obstetrics and Gynecology | Admitting: Obstetrics and Gynecology

## 2021-07-19 DIAGNOSIS — N6489 Other specified disorders of breast: Secondary | ICD-10-CM

## 2021-07-19 DIAGNOSIS — N6310 Unspecified lump in the right breast, unspecified quadrant: Secondary | ICD-10-CM

## 2021-07-19 DIAGNOSIS — N6320 Unspecified lump in the left breast, unspecified quadrant: Secondary | ICD-10-CM

## 2022-01-17 ENCOUNTER — Ambulatory Visit
Admission: RE | Admit: 2022-01-17 | Discharge: 2022-01-17 | Disposition: A | Discharge status: Home / Self Care | Payer: BC Managed Care – PPO | Source: Ambulatory Visit | Attending: Obstetrics and Gynecology | Admitting: Obstetrics and Gynecology

## 2022-01-17 ENCOUNTER — Other Ambulatory Visit: Payer: Self-pay | Admitting: Obstetrics and Gynecology

## 2022-01-17 DIAGNOSIS — N6489 Other specified disorders of breast: Secondary | ICD-10-CM

## 2022-01-17 DIAGNOSIS — N6320 Unspecified lump in the left breast, unspecified quadrant: Secondary | ICD-10-CM

## 2022-01-17 DIAGNOSIS — N6310 Unspecified lump in the right breast, unspecified quadrant: Secondary | ICD-10-CM

## 2022-07-21 ENCOUNTER — Other Ambulatory Visit: Payer: BC Managed Care – PPO

## 2022-07-22 ENCOUNTER — Other Ambulatory Visit: Payer: BC Managed Care – PPO

## 2022-08-04 ENCOUNTER — Other Ambulatory Visit: Payer: BC Managed Care – PPO

## 2022-08-08 ENCOUNTER — Ambulatory Visit
Admission: RE | Admit: 2022-08-08 | Discharge: 2022-08-08 | Disposition: A | Discharge status: Home / Self Care | Payer: BC Managed Care – PPO | Source: Ambulatory Visit | Attending: Obstetrics and Gynecology | Admitting: Obstetrics and Gynecology

## 2022-08-08 ENCOUNTER — Other Ambulatory Visit: Payer: Self-pay | Admitting: Obstetrics and Gynecology

## 2022-08-08 DIAGNOSIS — N6310 Unspecified lump in the right breast, unspecified quadrant: Secondary | ICD-10-CM

## 2023-02-09 ENCOUNTER — Ambulatory Visit
Admission: RE | Admit: 2023-02-09 | Discharge: 2023-02-09 | Disposition: A | Discharge status: Home / Self Care | Payer: BC Managed Care – PPO | Source: Ambulatory Visit | Attending: Obstetrics and Gynecology | Admitting: Obstetrics and Gynecology

## 2023-02-09 DIAGNOSIS — N6310 Unspecified lump in the right breast, unspecified quadrant: Secondary | ICD-10-CM

## 2024-03-04 ENCOUNTER — Encounter: Payer: Self-pay | Admitting: Internal Medicine

## 2024-03-18 ENCOUNTER — Other Ambulatory Visit: Payer: Self-pay | Admitting: Obstetrics and Gynecology

## 2024-03-18 ENCOUNTER — Ambulatory Visit
Admission: RE | Admit: 2024-03-18 | Discharge: 2024-03-18 | Disposition: A | Discharge status: Home / Self Care | Source: Ambulatory Visit | Attending: Obstetrics and Gynecology | Admitting: Obstetrics and Gynecology

## 2024-03-18 DIAGNOSIS — Z1231 Encounter for screening mammogram for malignant neoplasm of breast: Secondary | ICD-10-CM

## 2024-04-12 ENCOUNTER — Ambulatory Visit (AMBULATORY_SURGERY_CENTER): Payer: Self-pay

## 2024-04-12 VITALS — Ht 64.5 in | Wt 163.0 lb

## 2024-04-12 DIAGNOSIS — Z1211 Encounter for screening for malignant neoplasm of colon: Secondary | ICD-10-CM

## 2024-04-12 MED ORDER — NA SULFATE-K SULFATE-MG SULF 17.5-3.13-1.6 GM/177ML PO SOLN: 1.0000 | Freq: Once | ORAL | 0 refills | Status: AC

## 2024-04-12 NOTE — Progress Notes (Signed)
 PCP MD at time of PV: Olivia Monte __________________________________________________________________________________________________________________________________________  No egg allergy known to patient  No soy allergy known to patient No issues known to pt with past sedation with any surgeries or procedures Patient denies ever being told they had issues or difficulty with intubation  No FH of Malignant Hyperthermia Pt is not on diet pills Pt is not on  home 02  Pt is not on blood thinners  No A fib or A flutter Have any cardiac testing pending--no  LOA: independent  No Chew or Snuff tobacco __________________________________________________________________________________________________________________________________________  Constipation: no Prep: suprep  __________________________________________________________________________________________________________________________________________  PV completed with patient. Prep instructions reviewed and provided during apt. Rx sent to preferred pharmacy.  __________________________________________________________________________________________________________________________________________

## 2024-04-23 ENCOUNTER — Encounter: Payer: Self-pay | Admitting: Internal Medicine

## 2024-04-26 ENCOUNTER — Emergency Department (HOSPITAL_COMMUNITY)
Admission: EM | Admit: 2024-04-26 | Discharge: 2024-04-26 | Disposition: A | Discharge status: Home / Self Care | Source: Ambulatory Visit | Attending: Emergency Medicine | Admitting: Emergency Medicine

## 2024-04-26 ENCOUNTER — Emergency Department (HOSPITAL_COMMUNITY)

## 2024-04-26 ENCOUNTER — Other Ambulatory Visit: Payer: Self-pay

## 2024-04-26 ENCOUNTER — Encounter: Admitting: Internal Medicine

## 2024-04-26 ENCOUNTER — Encounter (HOSPITAL_COMMUNITY): Payer: Self-pay

## 2024-04-26 DIAGNOSIS — M19012 Primary osteoarthritis, left shoulder: Secondary | ICD-10-CM | POA: Diagnosis not present

## 2024-04-26 DIAGNOSIS — M25512 Pain in left shoulder: Secondary | ICD-10-CM | POA: Diagnosis present

## 2024-04-26 MED ORDER — NAPROXEN 500 MG PO TABS
500.0000 mg | ORAL_TABLET | Freq: Once | ORAL | Status: AC
  Administered 2024-04-26: 500 mg via ORAL
  Filled 2024-04-26: qty 1

## 2024-04-26 MED ORDER — NAPROXEN 500 MG PO TABS
500.0000 mg | ORAL_TABLET | Freq: Two times a day (BID) | ORAL | 0 refills | Status: AC
Start: 2024-04-26 — End: ?

## 2024-04-26 MED ORDER — ACETAMINOPHEN ER 650 MG PO TBCR: 650.0000 mg | EXTENDED_RELEASE_TABLET | Freq: Three times a day (TID) | ORAL | 0 refills | Status: AC | PRN

## 2024-04-26 NOTE — ED Triage Notes (Signed)
 Patient states she fell asleep on her arm on Tuesday and when she woke up she had pain in the left arm. Patient was seen by her PCP and the recommended she be seen in the ER. Patient reports hitting the right shoulder 1 week ago and has been on muscle relaxer for the pain. States the pain is aching and throbbing. Rates pain 7/10.

## 2024-04-26 NOTE — ED Provider Notes (Signed)
 [facility] AT [facility] Provider Note   CSN: [id number] Arrival date & time: 04/26/24  8087     Patient presents with: Arm Pain   Crystal Norton is a 58 y.o. female.   HPI    58 year old female comes in with chief complaint of arm pain.  Patient states that she started having pain over her left shoulder 4 days ago.  She woke up, and noticed the pain.  Initially she felt soreness to the side and the left shoulder felt heavy.  She did not have any symptoms involving the entire left upper extremity.  Her discomfort is described as soreness, and is primarily located over the left shoulder region and it is worse with any movement.  She denies any specific trigger for this.  Patient has no lower extremity numbness, weakness, slurred speech, neck pain or headache.  Prior to Admission medications   Medication Sig Start Date End Date Taking? Authorizing Provider  acetaminophen  (TYLENOL  8 HOUR) 650 MG CR tablet Take 1 tablet (650 mg total) by mouth every 8 (eight) hours as needed for pain or fever. 04/26/24  Yes Charlyn Sora, MD  naproxen  (NAPROSYN ) 500 MG tablet Take 1 tablet (500 mg total) by mouth 2 (two) times daily. 04/26/24  Yes Charlyn Sora, MD  Cholecalciferol 25 MCG (1000 UT) capsule Take 1,000 Units by mouth daily. 12/08/17   [provider]  LYSINE PO Take 1 tablet by mouth daily.    [provider]  Multiple Vitamin (MULTIVITAMIN WITH MINERALS) TABS tablet Take 1 tablet by mouth daily.    [provider]  valACYclovir  (VALTREX ) 500 MG tablet Take 1 tablet (500 mg total) by mouth 2 (two) times daily. Patient taking differently: Take 500 mg by mouth 2 (two) times daily as needed. 01/19/12   Haygood, Vanessa P, MD    Allergies: Apple and Hazelnut (filbert)    Review of Systems  All other systems reviewed and are negative.   Updated Vital Signs BP (!) 166/98   Pulse (!) 112   Temp 98.6 F (37 C) (Oral)   Resp 16    Ht 5' 4 (1.626 m)   Wt 73.5 kg   LMP  (LMP Unknown)   SpO2 100%   BMI 27.81 kg/m   Physical Exam Vitals and nursing note reviewed.  Constitutional:      Appearance: She is well-developed.  HENT:     Head: Atraumatic.  Eyes:     Extraocular Movements: Extraocular movements intact.     Pupils: Pupils are equal, round, and reactive to light.  Cardiovascular:     Rate and Rhythm: Normal rate.  Pulmonary:     Effort: Pulmonary effort is normal.  Musculoskeletal:        General: Tenderness present.     Cervical back: Normal range of motion and neck supple.     Comments: Patient has tenderness to palpation of the [facility] joint and also posterior shoulder.  Patient's tenderness is reproducible with forward flexion, abduction and also with external rotation and when she tries to touch her back.  Skin:    General: Skin is warm and dry.  Neurological:     Mental Status: She is alert and oriented to person, place, and time.     Sensory: No sensory deficit.     Motor: No weakness.     (all labs ordered are listed, but only abnormal results are displayed) Labs Reviewed - No data to display  EKG: None  Radiology: DG Shoulder Left Result Date: 04/26/2024 EXAM: 1 VIEW(S) XRAY OF THE LEFT SHOULDER 04/26/2024 09:03:00 PM COMPARISON: None available. CLINICAL HISTORY: 855384 Pain 144615 Pain FINDINGS: BONES AND JOINTS: Glenohumeral joint is normally aligned. No acute fracture or dislocation. The [facility] joint is unremarkable in appearance. SOFT TISSUES: No abnormal calcifications. Visualized lung is unremarkable. IMPRESSION: 1. No significant abnormality. Electronically signed by: Franky Crease MD 04/26/2024 09:15 PM EST RP Workstation: [id number]     Procedures   Medications Ordered in the ED  naproxen  (NAPROSYN ) tablet 500 mg (has no administration in time range)                                    Medical Decision Making Amount and/or Complexity of Data Reviewed Radiology:  ordered.  Risk OTC drugs. Prescription drug management.   58 year old patient comes in with chief complaint of left shoulder pain.  Her pain is worse with abduction, forward flexion and with palpation of the shoulder.  Differential diagnosis for this patient includes rotator cuff tendinopathy, shoulder capsulitis, arthritis. Low clinical suspicion for stroke.  X-ray of the shoulder ordered and independently interpreted.  There is no evidence of dislocation or any severe arthritis.  I will advised patient to take Naprosyn  and follow-up with orthopedic doctor if not getting better.  RICE recommended.  Final diagnoses:  Arthropathy of left shoulder    ED Discharge Orders          Ordered    naproxen  (NAPROSYN ) 500 MG tablet  2 times daily        04/26/24 2153    acetaminophen  (TYLENOL  8 HOUR) 650 MG CR tablet  Every 8 hours PRN        04/26/24 2153               Charlyn Sora, MD 04/26/24 2348

## 2024-04-26 NOTE — Discharge Instructions (Addendum)
 You were seen in the emergency room for shoulder discomfort.  The x-ray does not show any evidence of profound arthritis.  Clinically it appears to us  that most likely you have shoulder tendinopathy/tendinitis, may be rotator cuff injury.  Please read the instructions provided.  RICE treatment is recommended.  Ice the shoulder at least 3 times a day for 20 minutes.  Call the orthopedic doctors to set up an appointment in 10 to 14 days.  No lifting more than 5 pounds with the left upper extremity and avoid lifting your arm above the shoulder level until seen by orthopedic.

## 2024-05-27 ENCOUNTER — Telehealth: Payer: Self-pay | Admitting: Internal Medicine

## 2024-05-27 NOTE — Progress Notes (Unsigned)
 [facility] History and Physical   Primary Care Physician:  Perri Klinefelter, DO   Reason for Procedure:    Encounter Diagnosis  Name Primary?   Colon cancer screening Yes     Plan:    Colonoscopy   The patient was provided an opportunity to ask questions and all were answered. The patient agreed with the plan.   HPI: Crystal Norton is a 58 y.o. female with a history of IBS presenting for a screening colonoscopy.  Normal examination in 2015.   Past Medical History:  Diagnosis Date   Allergy    Anxiety and depression    Breast asymmetry    Headache    HSV-2 (herpes simplex virus 2) infection    Hyperlipidemia    IBS (irritable bowel syndrome) 03/05/2014   Prediabetes     Past Surgical History:  Procedure Laterality Date   BREAST BIOPSY Left 02/26/2020   COLONOSCOPY     DILATATION & CURRETTAGE/HYSTEROSCOPY WITH RESECTOCOPE N/A 05/09/2014   Procedure: DILATATION & CURETTAGE/HYSTEROSCOPY WITH RESECTOCOPE;  Surgeon: Shanda SHAUNNA Muscat, MD;  Location: [facility];  Service: Gynecology;  Laterality: N/A;   needle biopsy of breast     WISDOM TOOTH EXTRACTION       Current Outpatient Medications  Medication Sig Dispense Refill   acetaminophen  (TYLENOL  8 HOUR) 650 MG CR tablet Take 1 tablet (650 mg total) by mouth every 8 (eight) hours as needed for pain or fever. 30 tablet 0   Cholecalciferol 25 MCG (1000 UT) capsule Take 1,000 Units by mouth daily.     LYSINE PO Take 1 tablet by mouth daily.     Multiple Vitamin (MULTIVITAMIN WITH MINERALS) TABS tablet Take 1 tablet by mouth daily.     naproxen  (NAPROSYN ) 500 MG tablet Take 1 tablet (500 mg total) by mouth 2 (two) times daily. 20 tablet 0   valACYclovir  (VALTREX ) 500 MG tablet Take 1 tablet (500 mg total) by mouth 2 (two) times daily. (Patient taking differently: Take 500 mg by mouth 2 (two) times daily as needed.) 60 tablet 12   No current facility-administered medications for this visit.    Allergies as of  05/28/2024 - Review Complete 04/26/2024  Allergen Reaction Noted   Apple  02/28/2020   Hazelnut (filbert)  02/28/2020    Family History  Problem Relation Age of Onset   Hypertension Mother    Hypertension Father    Diabetes Father    Heart disease Father    Breast cancer Paternal Aunt    Breast cancer Paternal Aunt    Breast cancer Cousin        paternal first cousin   Colon cancer Other    Stroke Other    Heart disease Other    Diabetes Other    Esophageal cancer Neg Hx    Pancreatic cancer Neg Hx    Rectal cancer Neg Hx    Stomach cancer Neg Hx     Social History   Socioeconomic History   Marital status: Single    Spouse name: Not on file   Number of children: Not on file   Years of education: Not on file   Highest education level: Not on file  Occupational History   Not on file  Tobacco Use   Smoking status: Never   Smokeless tobacco: Never  Substance and Sexual Activity   Alcohol use: No   Drug use: No   Sexual activity: Never    Birth control/protection: Abstinence, None  Other Topics Concern  Not on file  Social History Narrative   Teacher - [employer]   Lives alone   Single no children   [employer] Bachelor's and Master's   Social Drivers of Health   Tobacco Use: Low Risk (05/22/2024)   Received from [facility]   Patient History    Smoking Tobacco Use: Never    Smokeless Tobacco Use: Never    Passive Exposure: Never  Financial Resource Strain: Low Risk (05/22/2024)   Received from [facility]   Overall Financial Resource Strain (CARDIA)    How hard is it for you to pay for the very basics like food, housing, medical care, and heating?: Not hard at all  Food Insecurity: No Food Insecurity (05/22/2024)   Received from [facility]   Epic    Within the past 12 months, you worried that your food would run out before you got the money to buy more.: Never true    Within the past 12 months, the food you bought just didn't last and  you didn't have money to get more.: Never true  Transportation Needs: No Transportation Needs (05/22/2024)   Received from [facility]    In the past 12 months, has lack of transportation kept you from medical appointments or from getting medications?: No    In the past 12 months, has lack of transportation kept you from meetings, work, or from getting things needed for daily living?: No  Physical Activity: Inactive (05/22/2024)   Received from [facility]   Exercise Vital Sign    On average, how many days per week do you engage in moderate to strenuous exercise (like a brisk walk)?: 0 days    Minutes of Exercise per Session: Not on file  Stress: No Stress Concern Present (05/22/2024)   Received from [facility] - Occupational Stress Questionnaire    Do you feel stress - tense, restless, nervous, or anxious, or unable to sleep at night because your mind is troubled all the time - these days?: Not at all  Social Connections: Socially Integrated (05/22/2024)   Received from [facility]   Social Network    How would you rate your social network (family, work, friends)?: Good participation with social networks  Intimate Partner Violence: Not At Risk (05/22/2024)   Received from [facility]   HITS    Over the last 12 months how often did your partner physically hurt you?: Never    Over the last 12 months how often did your partner insult you or talk down to you?: Never    Over the last 12 months how often did your partner threaten you with physical harm?: Never    Over the last 12 months how often did your partner scream or curse at you?: Never  Depression (PHQ2-9): Not on file  Alcohol Screen: Not on file  Housing: Low Risk (05/22/2024)   [facility]    In the last 12 months, was there a time when you were not able to pay the mortgage or rent on time?: No    In the past 12 months, how many times have you  moved where you were living?: 0    At any time in the past 12 months, were you homeless or living in a shelter (including now)?: No  Utilities: Not At Risk (05/22/2024)   Received from [facility]  In the past 12 months has the electric, gas, oil, or water company threatened to shut off services in your home?: No  Health Literacy: Not on file    Review of Systems: Positive for *** All other review of systems negative except as mentioned in the HPI.  Physical Exam: Vital signs LMP  (LMP Unknown)   General:   Alert,  Well-developed, well-nourished, pleasant and cooperative in NAD Lungs:  Clear throughout to auscultation.   Heart:  Regular rate and rhythm; no murmurs, clicks, rubs,  or gallops. Abdomen:  Soft, nontender and nondistended. Normal bowel sounds.   Neuro/Psych:  Alert and cooperative. Normal mood and affect. A and O x 3   @Markeisha Mancias  CHARLENA Commander, MD, [facility] [phone] (pager) 05/27/2024 4:37 PM@

## 2024-05-27 NOTE — Telephone Encounter (Signed)
 Inbound call from patient stating she would like to speak to nurse in regards to prep medication since after reading side effects she does not feel comfortable taking. Patient is scheduled for a colonoscopy tomorrow 05/28/24  Please advise  Thank you

## 2024-05-27 NOTE — Telephone Encounter (Signed)
 Spoke with patient not comfortable with taking suprep. Side effects explained. Pt requested to do same prep as her last colonoscopy. Miralax instructions provided for patient.

## 2024-05-28 ENCOUNTER — Encounter: Payer: Self-pay | Admitting: Internal Medicine

## 2024-05-28 ENCOUNTER — Ambulatory Visit: Admitting: Internal Medicine

## 2024-05-28 VITALS — BP 142/89 | HR 90 | Temp 97.1°F | Resp 16 | Ht 64.5 in | Wt 163.0 lb

## 2024-05-28 DIAGNOSIS — Z1211 Encounter for screening for malignant neoplasm of colon: Secondary | ICD-10-CM

## 2024-05-28 MED ORDER — SODIUM CHLORIDE 0.9 % IV SOLN: 500.0000 mL | Freq: Once | INTRAVENOUS | Status: DC

## 2024-05-28 NOTE — Progress Notes (Signed)
 Report given to PACU, vss

## 2024-05-28 NOTE — Op Note (Signed)
 [facility] Patient Name: Crystal Norton Procedure Date: 05/28/2024 10:04 AM MRN: [id number] Endoscopist: Lupita FORBES Commander , MD, [id number] Age: 58 Referring MD:  Date of Birth: 06/06/1966 Gender: Female Account #: [id number] Procedure:                Colonoscopy Indications:              Screening for colorectal malignant neoplasm, Last                            colonoscopy: 2015 Medicines:                Monitored Anesthesia Care Procedure:                Pre-Anesthesia Assessment:                           - Prior to the procedure, a History and Physical                            was performed, and patient medications and                            allergies were reviewed. The patient's tolerance of                            previous anesthesia was also reviewed. The risks                            and benefits of the procedure and the sedation                            options and risks were discussed with the patient.                            All questions were answered, and informed consent                            was obtained. Prior Anticoagulants: The patient has                            taken no anticoagulant or antiplatelet agents. ASA                            Grade Assessment: II - A patient with mild systemic                            disease. After reviewing the risks and benefits,                            the patient was deemed in satisfactory condition to                            undergo the procedure.  After obtaining informed consent, the colonoscope                            was passed under direct vision. Throughout the                            procedure, the patient's blood pressure, pulse, and                            oxygen saturations were monitored continuously. The                            PCF-HQ190L Colonoscope [id number] was introduced                            through the anus and advanced to the the  cecum,                            identified by appendiceal orifice and ileocecal                            valve. The colonoscopy was performed without                            difficulty. The patient tolerated the procedure                            well. The quality of the bowel preparation was                            good. The ileocecal valve and the appendiceal                            orifice were photographed. The bowel preparation                            used was Miralax via split dose instruction. Scope In: 10:35:37 AM Scope Out: 10:47:54 AM Scope Withdrawal Time: 0 hours 8 minutes 28 seconds  Total Procedure Duration: 0 hours 12 minutes 17 seconds  Findings:                 The perianal and digital rectal examinations were                            normal.                           The entire examined colon appeared normal on direct                            and retroflexion views. Complications:            No immediate complications. Estimated Blood Loss:     Estimated blood loss: none. Impression:               - The entire examined  colon is normal on direct and                            retroflexion views.                           - No specimens collected. Recommendation:           - Patient has a contact number available for                            emergencies. The signs and symptoms of potential                            delayed complications were discussed with the                            patient. Return to normal activities tomorrow.                            Written discharge instructions were provided to the                            patient.                           - Resume previous diet.                           - Continue present medications.                           - Repeat colonoscopy in 10 years for screening                            purposes. Lupita FORBES Commander, MD 05/28/2024 10:53:42 AM This report has been signed  electronically.

## 2024-05-28 NOTE — Progress Notes (Signed)
 Pt's states no medical or surgical changes since previsit or office visit.

## 2024-05-28 NOTE — Patient Instructions (Addendum)
 Normal colonoscopy again today.  No polyps or cancer were seen.  You should repeat a colonoscopy or other appropriate screening test in 10 years.  I appreciate the opportunity to care for you. Crystal CHARLENA Commander, MD, FACG  YOU HAD AN ENDOSCOPIC PROCEDURE TODAY AT THE [facility]:   Refer to the procedure report that was given to you for any specific questions about what was found during the examination.  If the procedure report does not answer your questions, please call your gastroenterologist to clarify.  If you requested that your care partner not be given the details of your procedure findings, then the procedure report has been included in a sealed envelope for you to review at your convenience later.  YOU SHOULD EXPECT: Some feelings of bloating in the abdomen. Passage of more gas than usual.  Walking can help get rid of the air that was put into your GI tract during the procedure and reduce the bloating. If you had a lower endoscopy (such as a colonoscopy or flexible sigmoidoscopy) you may notice spotting of blood in your stool or on the toilet paper. If you underwent a bowel prep for your procedure, you may not have a normal bowel movement for a few days.  Please Note:  You might notice some irritation and congestion in your nose or some drainage.  This is from the oxygen used during your procedure.  There is no need for concern and it should clear up in a day or so.  SYMPTOMS TO REPORT IMMEDIATELY:  Following lower endoscopy (colonoscopy or flexible sigmoidoscopy):  Excessive amounts of blood in the stool  Significant tenderness or worsening of abdominal pains  Swelling of the abdomen that is new, acute  Fever of 100F or higher  For urgent or emergent issues, a gastroenterologist can be reached at any hour by calling [phone]. Do not use MyChart messaging for urgent concerns.    DIET:  We do recommend a small meal at first, but then you may proceed to your regular  diet.  Drink plenty of fluids but you should avoid alcoholic beverages for 24 hours.  ACTIVITY:  You should plan to take it easy for the rest of today and you should NOT DRIVE or use heavy machinery until tomorrow (because of the sedation medicines used during the test).    FOLLOW UP: Our staff will call the number listed on your records the next business day following your procedure.  We will call around 7:15- 8:00 am to check on you and address any questions or concerns that you may have regarding the information given to you following your procedure. If we do not reach you, we will leave a message.     If any biopsies were taken you will be contacted by phone or by letter within the next 1-3 weeks.  Please call us  at [phone] if you have not heard about the biopsies in 3 weeks.    SIGNATURES/CONFIDENTIALITY: You and/or your care partner have signed paperwork which will be entered into your electronic medical record.  These signatures attest to the fact that that the information above on your After Visit Summary has been reviewed and is understood.  Full responsibility of the confidentiality of this discharge information lies with you and/or your care-partner.

## 2024-05-29 ENCOUNTER — Telehealth: Payer: Self-pay | Admitting: *Deleted

## 2024-05-29 NOTE — Telephone Encounter (Signed)
" °  Follow up Call-     05/28/2024   10:13 AM  Call back number  Post procedure Call Back phone  # [phone]  Permission to leave phone message Yes     Patient questions:  Do you have a fever, pain , or abdominal swelling? No. Pain Score  0 *  Have you tolerated food without any problems? Yes.    Have you been able to return to your normal activities? Yes.    Do you have any questions about your discharge instructions: Diet   No. Medications  No. Follow up visit  No.  Do you have questions or concerns about your Care? No.  Actions: * If pain score is 4 or above: No action needed, pain <4.   "
